# Patient Record
Sex: Female | Born: 1935 | Race: White | Hispanic: No | Smoking: Never smoker
Health system: Southern US, Community
[De-identification: ages and names within clinical notes are randomized; demographics above are authoritative.]

## PROBLEM LIST (undated history)

## (undated) DIAGNOSIS — Z9289 Personal history of other medical treatment: Secondary | ICD-10-CM

## (undated) DIAGNOSIS — Q211 Atrial septal defect: Secondary | ICD-10-CM

## (undated) DIAGNOSIS — I480 Paroxysmal atrial fibrillation: Secondary | ICD-10-CM

## (undated) DIAGNOSIS — R609 Edema, unspecified: Secondary | ICD-10-CM

## (undated) DIAGNOSIS — L719 Rosacea, unspecified: Secondary | ICD-10-CM

## (undated) DIAGNOSIS — M1990 Unspecified osteoarthritis, unspecified site: Secondary | ICD-10-CM

## (undated) DIAGNOSIS — Q2112 Patent foramen ovale: Secondary | ICD-10-CM

## (undated) HISTORY — DX: Edema, unspecified: R60.9

## (undated) HISTORY — DX: Patent foramen ovale: Q21.12

## (undated) HISTORY — PX: ANKLE SURGERY: SHX546

## (undated) HISTORY — DX: Personal history of other medical treatment: Z92.89

## (undated) HISTORY — DX: Unspecified osteoarthritis, unspecified site: M19.90

## (undated) HISTORY — PX: PARTIAL HYSTERECTOMY: SHX80

## (undated) HISTORY — PX: REPLACEMENT TOTAL KNEE: SUR1224

## (undated) HISTORY — DX: Atrial septal defect: Q21.1

## (undated) HISTORY — DX: Rosacea, unspecified: L71.9

## (undated) HISTORY — DX: Paroxysmal atrial fibrillation: I48.0

## (undated) HISTORY — PX: RECTOCELE REPAIR: SHX761

## (undated) HISTORY — PX: HIP SURGERY: SHX245

---

## 2005-09-06 ENCOUNTER — Inpatient Hospital Stay (HOSPITAL_COMMUNITY): Admission: RE | Admit: 2005-09-06 | Discharge: 2005-09-11 | Payer: Self-pay | Admitting: Orthopedic Surgery

## 2005-12-06 ENCOUNTER — Ambulatory Visit (HOSPITAL_COMMUNITY): Admission: RE | Admit: 2005-12-06 | Discharge: 2005-12-06 | Payer: Self-pay | Admitting: Orthopedic Surgery

## 2006-02-07 ENCOUNTER — Encounter: Payer: Self-pay | Admitting: Cardiology

## 2006-09-17 IMAGING — CR DG CHEST 2V
2 series · 2 of 2 positions shown · non-contrast
Comparison: None.

CLINICAL DATA: Pre-op workup for right knee surgery.  History of left breast tumor.  Hypertension. 
 CHEST - 2 VIEW:

[view not recorded (1 of 2)]
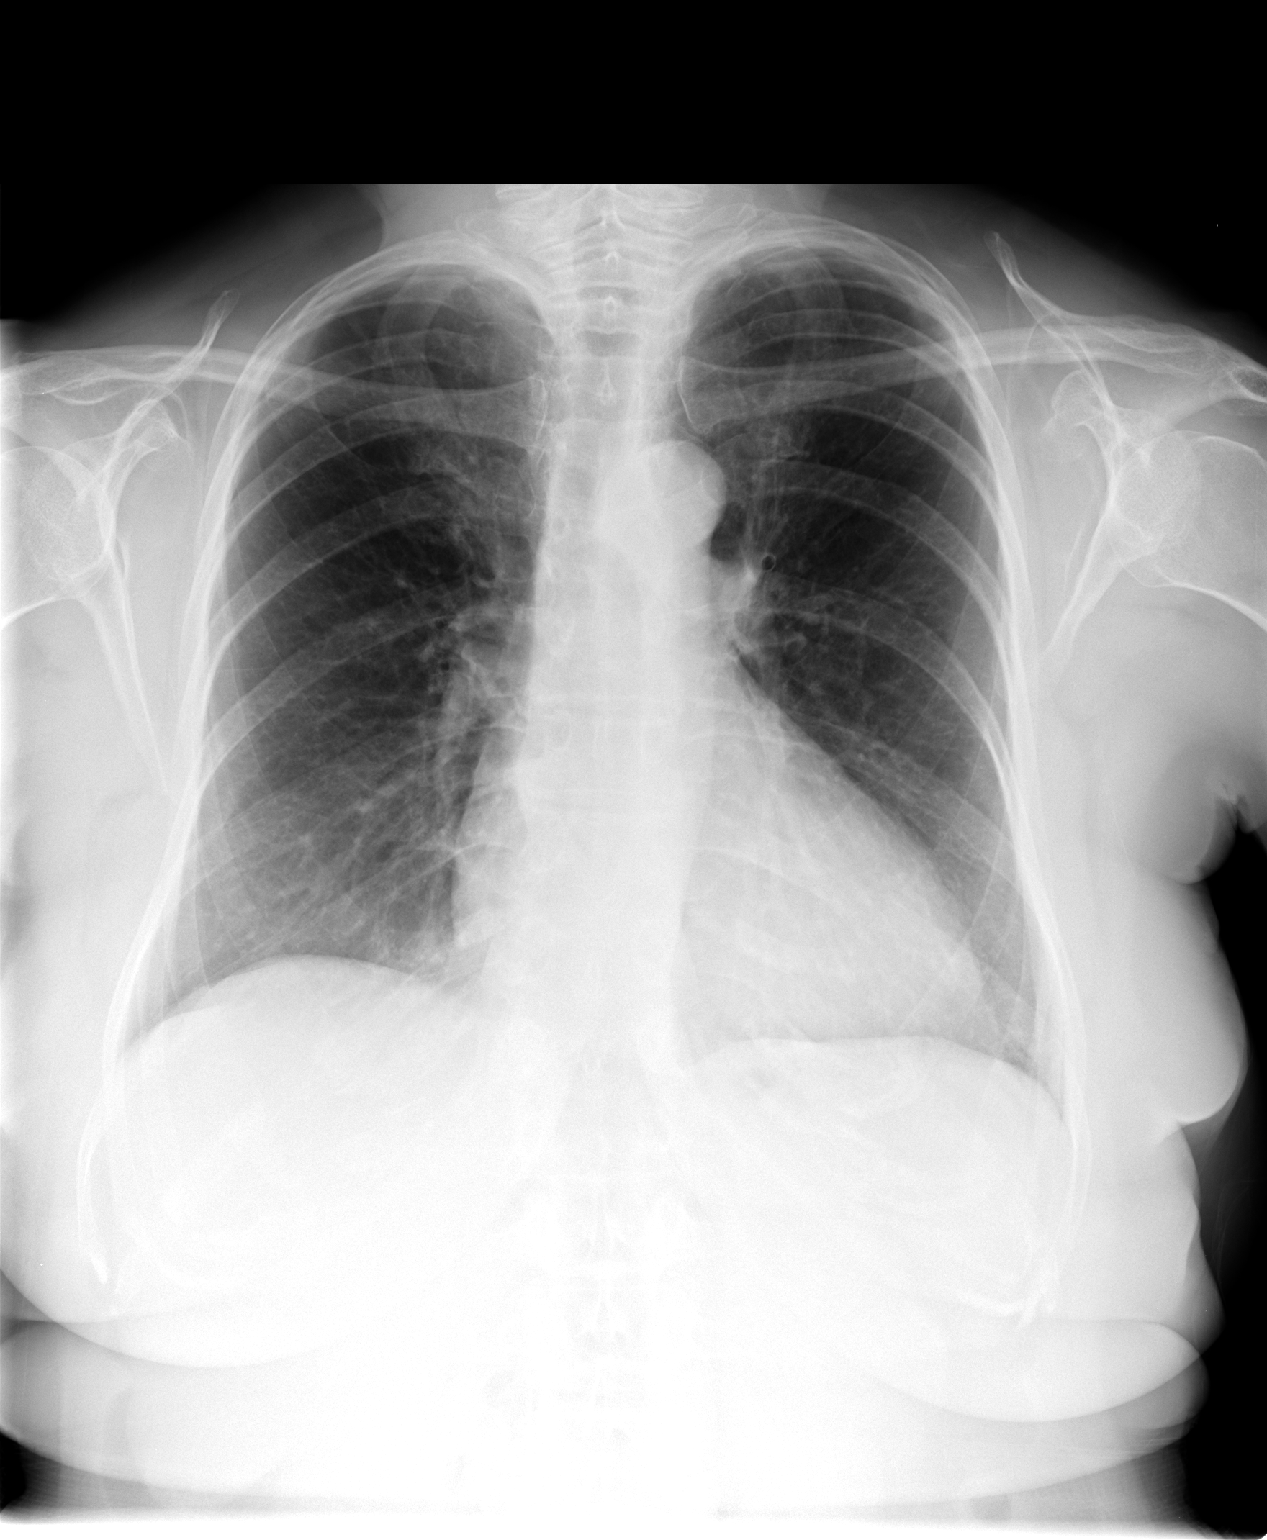

[view not recorded (2 of 2)]
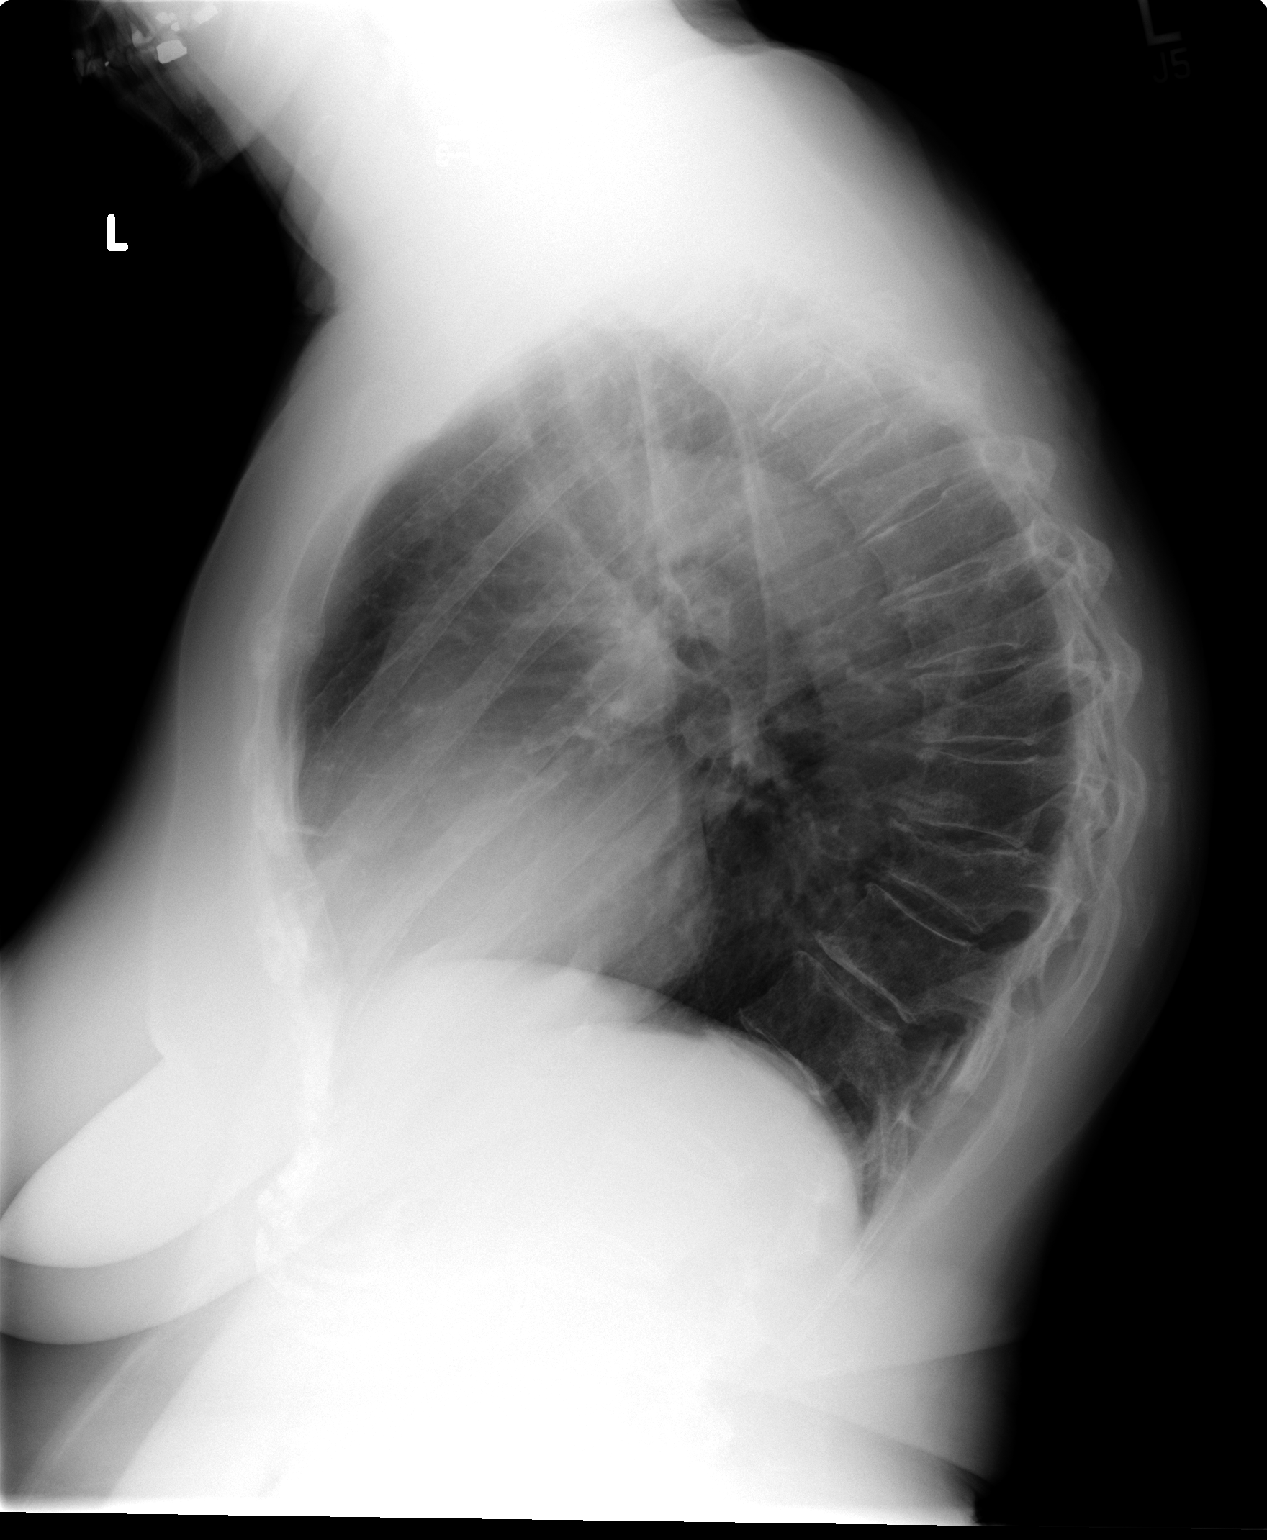

[2 of 2 positions shown; findings below may reference images not displayed]

FINDINGS: Cardiomegaly.  No pulmonary vascular congestion or active lung process.  Accentuated thoracic kyphosis.  Degenerative changes mid to lower thoracic spine.  Anterior wedging of a lower thoracic vertebral body probably T-10 likely representing a chronic finding.
IMPRESSION: Mild cardiomegaly.  No acute chest findings.

## 2006-09-24 IMAGING — CR DG KNEE 1-2V*R*
2 series · 2 of 2 positions shown · non-contrast
Comparison: none

CLINICAL DATA: Post right TKA revision.  
 RIGHT KNEE ? 2 VIEW:
 Distal femoral, proximal tibial and patella prosthetic components appear in satisfactory position and alignment.  Radiopaque drain in the anterior row of superficial staples.  Methyl methacrylate surrounds the intramedullary aspect of the tibial prosthesis.

[view not recorded (1 of 2)]
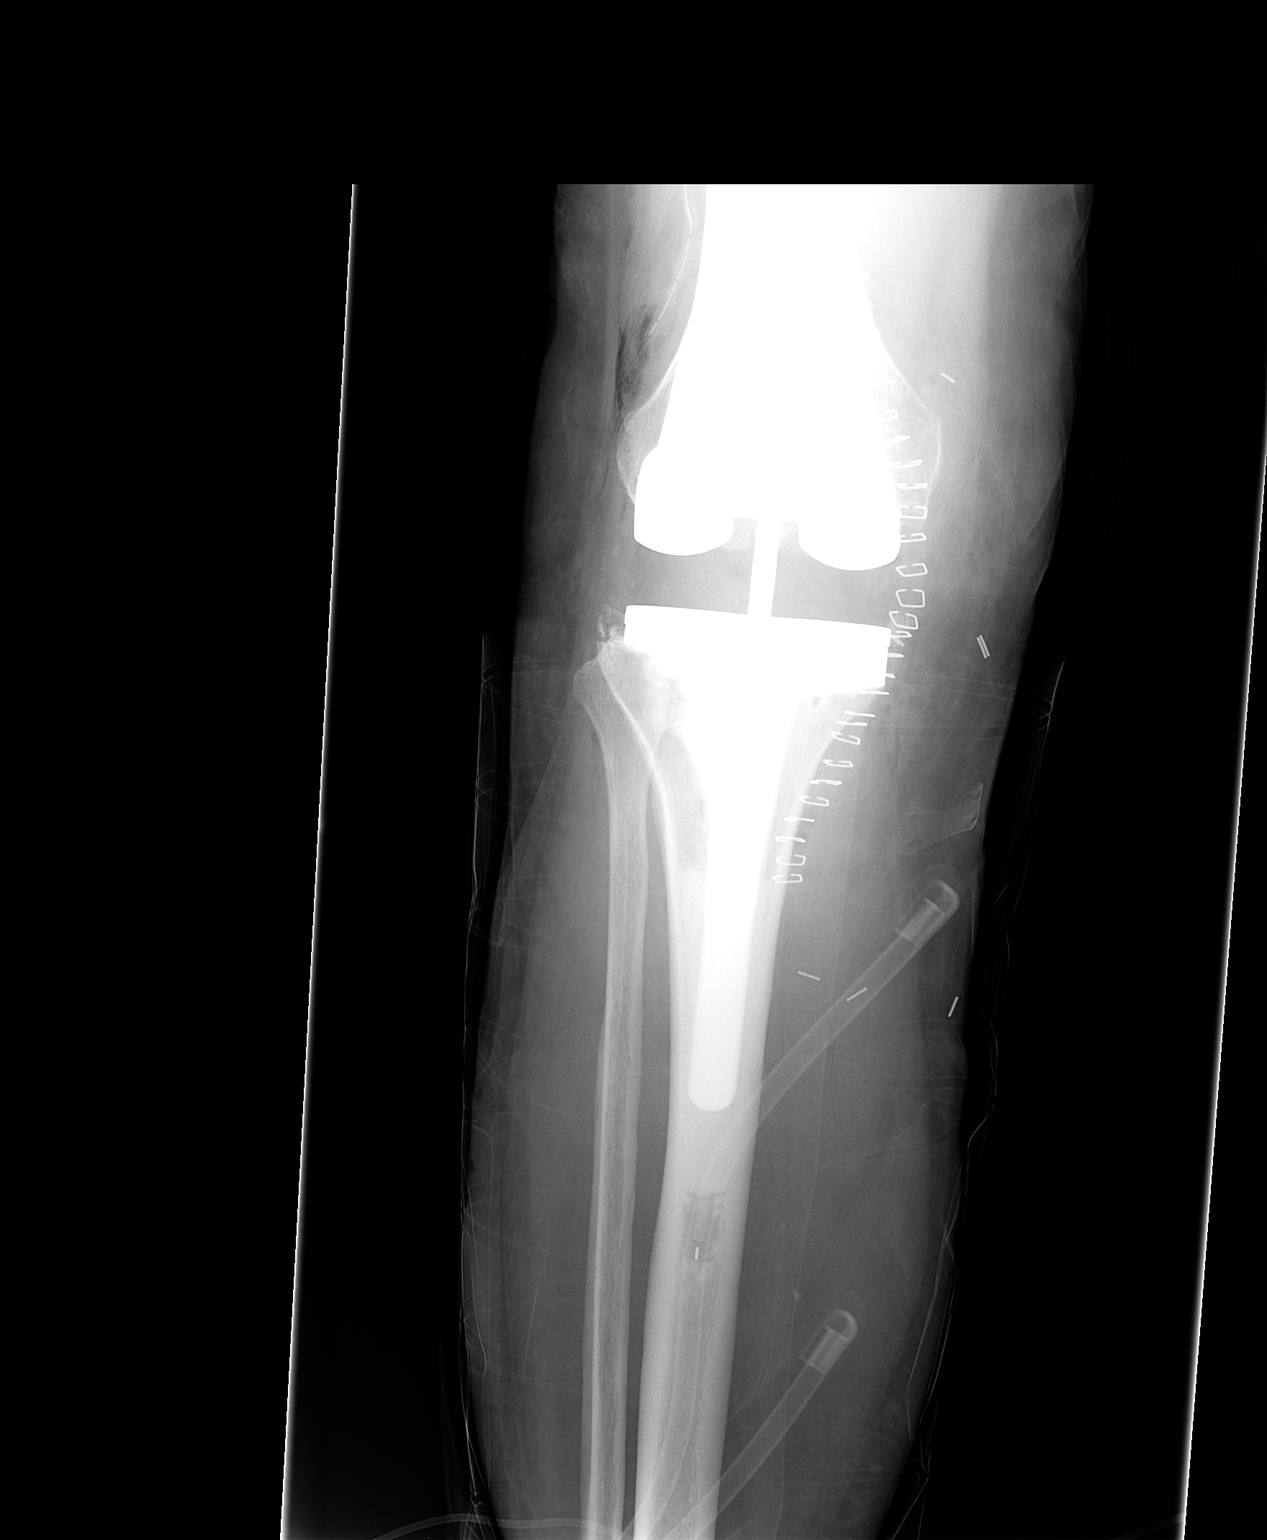

[view not recorded (2 of 2)]
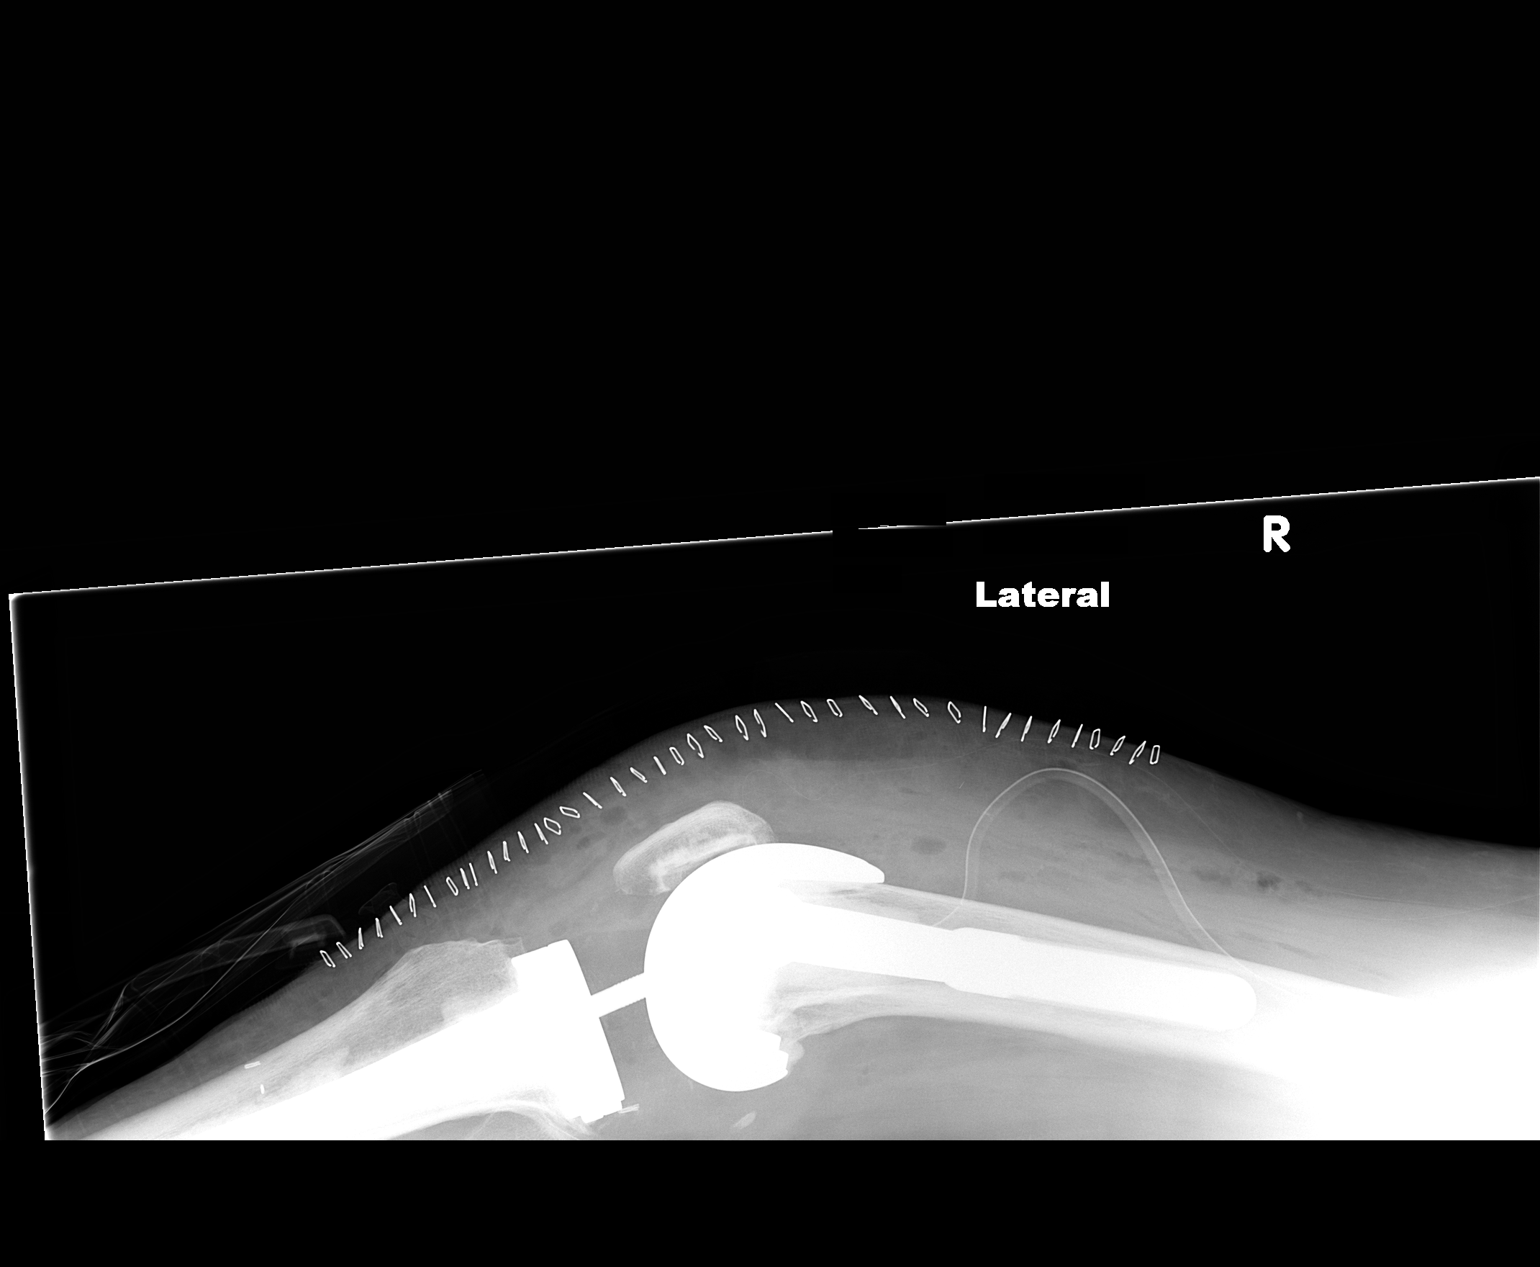

[2 of 2 positions shown; findings below may reference images not displayed]

IMPRESSION: Satisfactory appearance of right TKR.

## 2007-06-25 ENCOUNTER — Ambulatory Visit: Payer: Self-pay | Admitting: Cardiology

## 2007-06-28 ENCOUNTER — Encounter: Payer: Self-pay | Admitting: Cardiology

## 2007-07-15 ENCOUNTER — Ambulatory Visit: Payer: Self-pay | Admitting: Cardiology

## 2007-07-25 ENCOUNTER — Encounter: Payer: Self-pay | Admitting: Cardiology

## 2007-08-20 ENCOUNTER — Ambulatory Visit: Payer: Self-pay | Admitting: Cardiology

## 2007-10-07 ENCOUNTER — Encounter: Payer: Self-pay | Admitting: Cardiology

## 2008-04-28 ENCOUNTER — Ambulatory Visit: Payer: Self-pay | Admitting: Cardiology

## 2008-04-28 ENCOUNTER — Encounter: Payer: Self-pay | Admitting: Cardiology

## 2008-04-28 DIAGNOSIS — M818 Other osteoporosis without current pathological fracture: Secondary | ICD-10-CM | POA: Insufficient documentation

## 2008-04-28 DIAGNOSIS — F3289 Other specified depressive episodes: Secondary | ICD-10-CM | POA: Insufficient documentation

## 2008-04-28 DIAGNOSIS — I4891 Unspecified atrial fibrillation: Secondary | ICD-10-CM

## 2008-04-28 DIAGNOSIS — K120 Recurrent oral aphthae: Secondary | ICD-10-CM | POA: Insufficient documentation

## 2008-04-28 DIAGNOSIS — E119 Type 2 diabetes mellitus without complications: Secondary | ICD-10-CM | POA: Insufficient documentation

## 2008-04-29 ENCOUNTER — Encounter: Payer: Self-pay | Admitting: Cardiology

## 2008-07-29 ENCOUNTER — Encounter: Payer: Self-pay | Admitting: Cardiology

## 2009-01-20 ENCOUNTER — Encounter (INDEPENDENT_AMBULATORY_CARE_PROVIDER_SITE_OTHER): Payer: Self-pay | Admitting: *Deleted

## 2009-01-29 ENCOUNTER — Ambulatory Visit: Payer: Self-pay | Admitting: Cardiology

## 2009-01-29 DIAGNOSIS — F411 Generalized anxiety disorder: Secondary | ICD-10-CM

## 2009-01-29 DIAGNOSIS — Z8679 Personal history of other diseases of the circulatory system: Secondary | ICD-10-CM | POA: Insufficient documentation

## 2009-02-24 ENCOUNTER — Encounter: Payer: Self-pay | Admitting: Cardiology

## 2009-03-01 ENCOUNTER — Encounter: Payer: Self-pay | Admitting: Cardiology

## 2009-03-04 ENCOUNTER — Encounter (INDEPENDENT_AMBULATORY_CARE_PROVIDER_SITE_OTHER): Payer: Self-pay | Admitting: *Deleted

## 2009-03-08 ENCOUNTER — Ambulatory Visit: Payer: Self-pay | Admitting: Cardiology

## 2009-05-20 ENCOUNTER — Encounter: Payer: Self-pay | Admitting: Cardiology

## 2009-05-20 ENCOUNTER — Encounter: Payer: Self-pay | Admitting: Physician Assistant

## 2009-05-20 ENCOUNTER — Ambulatory Visit: Payer: Self-pay | Admitting: Cardiology

## 2009-05-21 ENCOUNTER — Encounter: Payer: Self-pay | Admitting: Cardiology

## 2009-06-22 ENCOUNTER — Ambulatory Visit: Payer: Self-pay | Admitting: Cardiology

## 2009-06-30 ENCOUNTER — Ambulatory Visit: Payer: Self-pay | Admitting: Cardiology

## 2009-07-14 ENCOUNTER — Telehealth (INDEPENDENT_AMBULATORY_CARE_PROVIDER_SITE_OTHER): Payer: Self-pay | Admitting: *Deleted

## 2009-07-28 ENCOUNTER — Encounter: Payer: Self-pay | Admitting: Cardiology

## 2009-07-28 ENCOUNTER — Ambulatory Visit: Payer: Self-pay | Admitting: Cardiology

## 2009-12-14 ENCOUNTER — Ambulatory Visit: Payer: Self-pay | Admitting: Cardiology

## 2010-06-28 NOTE — Progress Notes (Signed)
Summary: rash   Phone Note Call from Patient   Summary of Call: Pt. came by office today.  C/O of having rash since being started on her Digoxin on 1/25.  Rash appears to be small red areas that group together in varied spots on body.  States primarily from her waist down.  Does have itching with this.  Also, states she was given Metoprolol ER 25mg  1/2 tab two times a day by Dr. Sherril Croon, but is no longer seeing him.  Is now seeing Dr. Leandrew Koyanagi now.  Who should she obtain refill from?   Initial call taken by: Hoover Brunette, LPN,  July 14, 2009 5:01 PM  Follow-up for Phone Call        d/c digoxin and increase Metoprol ER to 25mg  by mouth two times a day- we can refill. Needs RN visit in 5-7 days to check vitals, ECG and see if rash still present.  Follow-up by: Lewayne Bunting, MD, [facility],  July 19, 2009 6:39 AM  Additional Follow-up for Phone Call Additional follow up Details #1::        Patient notified.   Nurse visit scheduled for 3/2.  Patient verbalized understanding.  Additional Follow-up by: Hoover Brunette, LPN,  July 20, 2009 10:53 AM    New/Updated Medications: METOPROLOL SUCCINATE 25 MG XR24H-TAB (METOPROLOL SUCCINATE) Take 1 tablet by mouth two times a day (place on file) Prescriptions: METOPROLOL SUCCINATE 25 MG XR24H-TAB (METOPROLOL SUCCINATE) Take 1 tablet by mouth two times a day (place on file)  #60 x 6   Entered by:   Hoover Brunette, LPN   Authorized by:   Lewayne Bunting, MD, [facility]   Signed by:   Hoover Brunette, LPN on 16/02/9603   Method used:   Electronically to        [facility]* (retail)       [address]       [place], [place]  [address]       Ph: [phone]       Fax: [phone]   RxID:   [id number]

## 2010-06-28 NOTE — Letter (Signed)
Summary: Discharge Summary [facility]  Discharge Summary [facility]   Imported By: Zachary George 06/22/2009 09:41:00  _____________________________________________________________________  External Attachment:    Type:   Image     Comment:   External Document

## 2010-06-28 NOTE — Consult Note (Signed)
Summary: CARDIOLOGY CONSULT/ [facility]  CARDIOLOGY CONSULT/ [facility]   Imported By: Zachary George 06/22/2009 09:40:13  _____________________________________________________________________  External Attachment:    Type:   Image     Comment:   External Document

## 2010-06-28 NOTE — Assessment & Plan Note (Signed)
Summary: ekg, vitals  --agh  Nurse Visit   Vital Signs:  Patient profile:   77 year old female Height:      61 inches Weight:      144 pounds Pulse rate:   58 / minute BP sitting:   153 / 78  (left arm) Cuff size:   regular  Vitals Entered By: Carlye Grippe (July 28, 2009 8:46 AM)  Serial Vital Signs/Assessments:  Time      Position  BP       Pulse  Resp  Temp     By 8:51 AM             144/76                         Carlye Grippe  CC: nurse bp check,ekg Comments patients rash has cleared up now.     Preventive Screening-Counseling & Management  Alcohol-Tobacco     Smoking Status: never  Current Medications (verified): 1)  Warfarin Sodium 5 Mg Tabs (Warfarin Sodium) .... As Directed 2)  Actonel 35 Mg Tabs (Risedronate Sodium) .... Once A Week 3)  Ery-Tab 333 Mg Tbec (Erythromycin Base) .... Take 1 Tablet By Mouth Two Times A Day 4)  Multivitamin .... One Tablet P.o. Daily 5)  Calcium and Vitamin D .... Two Times A Day 6)  Metoprolol Succinate 25 Mg Xr24h-Tab (Metoprolol Succinate) .... Take 1 Tablet By Mouth Two Times A Day (Place On File) 7)  Potassium Chloride Crys Cr 20 Meq Cr-Tabs (Potassium Chloride Crys Cr) .... Take One Tablet By Mouth Daily 8)  Amitriptyline Hcl 25 Mg Tabs (Amitriptyline Hcl) .... Take One Half Tablet By Mouth At Bedtime As Needed 9)  Metronidazole 0.75 % Lotn (Metronidazole) .... Apply To Face Once Daily 10)  Pandel 0.1 % Crea (Hydrocortisone Probutate) .... Apply To Face Once Daily 11)  Metamucil 30.9 % Powd (Psyllium) .... Once Daily 12)  Stool Softner .... Take 1 Tab By Mouth At Bedtime 13)  Toprol Xl 25 Mg Xr24h-Tab (Metoprolol Succinate) .... Take 1 Tablet By Mouth Once A Day As Needed For Rapid Heartbeat  Allergies (verified): 1)  ! Digoxin 2)  Keflex  Comments:  Nurse/Medical Assistant: The patient's medications and allergies were reviewed with the patient and were updated in the Medication and Allergy Lists. List  reviewed.  Orders Added: 1)  EKG w/ Interpretation [93000]  rash resolving after stopping digoxin. ECG normal sinus rhythm. Continue current therapy.  Lewayne Bunting, MD, [facility]  July 28, 2009 9:02 AM  Appended Document: ekg, vitals  --agh Patient informed of the above.

## 2010-06-28 NOTE — Assessment & Plan Note (Signed)
Summary: 6 MO FU PER APRIL REMINDER   Visit Type:  Follow-up Primary Provider:  Dr. Leandrew Koyanagi   History of Present Illness: the patient is an 77 year old female with history of paroxysmal atrial fibrillation. The patient has had no recurrent episodes. She has no evidence of significant coronary artery disease with a negative adenosine Cardiolite study in February 2009. She is asymptomatic from cardiac standpoint.  She reports no publications presyncope or syncope. She has no chest pain. She has some lesions of poison ivy no left neck area.  She does report a dry mouth and sore gums which he feels is secondary to her metoprolol. She has a history of rosacea  Preventive Screening-Counseling & Management  Alcohol-Tobacco     Smoking Status: never  Current Medications (verified): 1)  Warfarin Sodium 5 Mg Tabs (Warfarin Sodium) .... As Directed 2)  Actonel 35 Mg Tabs (Risedronate Sodium) .... Once A Week 3)  Ery-Tab 333 Mg Tbec (Erythromycin Base) .... Take 1 Tablet By Mouth Two Times A Day 4)  Multivitamin .... One Tablet P.o. Daily 5)  Calcium and Vitamin D .... Two Times A Day 6)  Atenolol 50 Mg Tabs (Atenolol) .... Take 1 Tablet By Mouth Once A Day 7)  Potassium Chloride Crys Cr 20 Meq Cr-Tabs (Potassium Chloride Crys Cr) .... Take One Tablet By Mouth Daily 8)  Amitriptyline Hcl 25 Mg Tabs (Amitriptyline Hcl) .... Take One Half Tablet By Mouth At Bedtime As Needed 9)  Metronidazole 0.75 % Lotn (Metronidazole) .... Apply To Face Once Daily 10)  Pandel 0.1 % Crea (Hydrocortisone Probutate) .... Apply To Face Once Daily 11)  Metamucil 30.9 % Powd (Psyllium) .... Once Daily 12)  Stool Softner .... Take 1 Tab By Mouth At Bedtime 13)  Toprol Xl 25 Mg Xr24h-Tab (Metoprolol Succinate) .... Take 1 Tablet By Mouth Once A Day As Needed For Rapid Heartbeat 14)  Lasix 20 Mg Tabs (Furosemide) .... Take By Mouth As Needed 15)  Desoximetasone 0.25 % Crea (Desoximetasone) .... Apply Daily As Needed For  Poison Ivy  Allergies: 1)  ! Digoxin 2)  Keflex  Comments:  Nurse/Medical Assistant: The patient's medications were reviewed with the patient and were updated in the Medication List. Pt brought a list of medications to office visit.  Cyril Loosen, RN, BSN (December 14, 2009 3:29 PM)  Past History:  Family History: Last updated: 04/28/2008 noncontributory  Social History: Last updated: 04/28/2008 the patient is widowed she does not smoke or drink.  Risk Factors: Smoking Status: never (12/14/2009)  Past Medical History: paroxysmal atrial fibrillation,CHADS2 of two Coumadin therapy follow-up in Dr. Sherril Croon diabetes mellitus nonischemic cardio lyte study February of 2009 normal LV function osteoarthritis chronic pedal edema fatigue Rosacea  Review of Systems       The patient complains of rash and skin lesions.  The patient denies fatigue, malaise, fever, weight gain/loss, vision loss, decreased hearing, hoarseness, chest pain, palpitations, shortness of breath, prolonged cough, wheezing, sleep apnea, coughing up blood, abdominal pain, blood in stool, nausea, vomiting, diarrhea, heartburn, incontinence, blood in urine, muscle weakness, joint pain, leg swelling, headache, fainting, dizziness, depression, anxiety, enlarged lymph nodes, easy bruising or bleeding, and environmental allergies.    Vital Signs:  Patient profile:   77 year old female Height:      61 inches Weight:      146.50 pounds BMI:     27.78 Pulse rate:   55 / minute BP sitting:   143 / 78  (left arm) Cuff size:  regular  Vitals Entered By: Cyril Loosen, RN, BSN (December 14, 2009 3:24 PM)  Nutrition Counseling: Patient's BMI is greater than 25 and therefore counseled on weight management options. Comments Follow up visit   Physical Exam  Additional Exam:  General: Well-developed, well-nourished in no distress head: Normocephalic and atraumatic eyes PERRLA/EOMI intact, conjunctiva and lids  normal nose: No deformity or lesions mouth normal dentition, normal posterior pharynx neck: Supple, no JVD.  No masses, thyromegaly or abnormal cervical nodes lungs: Normal breath sounds bilaterally without wheezing.  Normal percussion heart: regular rate and rhythm with normal S1 and S2, no S3 or S4.  PMI is normal.  No pathological murmurs abdomen: Normal bowel sounds, abdomen is soft and nontender without masses, organomegaly or hernias noted.  No hepatosplenomegaly musculoskeletal: Back normal, normal gait muscle strength and tone normal pulsus: Pulse is normal in all 4 extremities Extremities: No peripheral pitting edema neurologic: Alert and oriented x 3 skin: Intact without lesions or rashes cervical nodes: No significant adenopathy psychologic: Normal affect    Impression & Recommendations:  Problem # 1:  COUMADIN THERAPY (ICD-V58.61) could consider switching the patient to dabigatran particularly because she's taking multiple antibiotics control the rosacea. If her INR is quite difficult to control then I would certainly make the switch.  Problem # 2:  PALPITATIONS, HX OF (ICD-V12.50) no recurrent palpitations the patient is stable. I did change her from Toprol to atenolol but she feels she has side effects to the Toprol as outlined above. I told her it is worthwhile to try a different beta blocker  Problem # 3:  ATRIAL FIBRILLATION (ICD-427.31) the patient remains in normal sinus rhythm. Her updated medication list for this problem includes:    Warfarin Sodium 5 Mg Tabs (Warfarin sodium) .Marland Kitchen... As directed    Atenolol 50 Mg Tabs (Atenolol) .Marland Kitchen... Take 1 tablet by mouth once a day    Toprol Xl 25 Mg Xr24h-tab (Metoprolol succinate) .Marland Kitchen... Take 1 tablet by mouth once a day as needed for rapid heartbeat  Orders: EKG w/ Interpretation (93000)  Patient Instructions: 1)  Desoximetasone topical cream for poison ivy  2)  Stop Metoprolol 3)  Begin Atenolol 50mg  daily 4)  Follow  up in  1 year Prescriptions: DESOXIMETASONE 0.25 % CREA (DESOXIMETASONE) apply daily as needed for poison ivy  #15gm x 1   Entered by:   Hoover Brunette, LPN   Authorized by:   Lewayne Bunting, MD, [facility]   Signed by:   Hoover Brunette, LPN on 82/50/5397   Method used:   Electronically to        [facility]* (retail)       [address]       [place], [place]  [address]       Ph: [phone]       Fax: [phone]   RxID:   [id number] ATENOLOL 50 MG TABS (ATENOLOL) Take 1 tablet by mouth once a day  #30 x 11   Entered by:   Hoover Brunette, LPN   Authorized by:   Lewayne Bunting, MD, [facility]   Signed by:   Hoover Brunette, LPN on 19/41/7408   Method used:   Electronically to        [facility]* (retail)       [address]       [place], [place]  [address]       Ph: [phone]  Fax: [phone]   RxID:   [id number]   Handout requested.

## 2010-06-28 NOTE — Assessment & Plan Note (Signed)
Summary: bp check & HR  Nurse Visit   Vital Signs:  Patient profile:   77 year old female Height:      61 inches Weight:      142 pounds Pulse rate:   58 / minute BP sitting:   137 / 80  (left arm) Cuff size:   regular  Vitals Entered By: Carlye Grippe (June 30, 2009 8:32 AM)  CC:Follow-up HTN--yes PNT:IRWERX meds?--yes Side effects?--no Chest pain, SOB, Dizziness?--no A/P: 1. HTN (401.1)             At goal?              If no, physician will be notified.              Follow up in ...Marland KitchenMarland KitchenMarland Kitchen  5 minutes was spent with the patient.     Serial Vital Signs/Assessments:  Time      Position  BP       Pulse  Resp  Temp     By 8:44 AM             131/81   56                    Carlye Grippe   Visit Type:  nurse BP check  CC:  nurse BP & HR check.  CC: nurse BP & HR check   Preventive Screening-Counseling & Management  Alcohol-Tobacco     Smoking Status: never  Current Medications (verified): 1)  Warfarin Sodium 5 Mg Tabs (Warfarin Sodium) .... As Directed 2)  Actonel 35 Mg Tabs (Risedronate Sodium) .... Once A Week 3)  Ery-Tab 333 Mg Tbec (Erythromycin Base) .... Take 1 Tablet By Mouth Two Times A Day 4)  Multivitamin .... One Tablet P.o. Daily 5)  Calcium and Vitamin D .... Two Times A Day 6)  Metoprolol Succinate 25 Mg Xr24h-Tab (Metoprolol Succinate) .... Take 1/2 Talbet By Mouth Two Times A Day 7)  Potassium Chloride Crys Cr 20 Meq Cr-Tabs (Potassium Chloride Crys Cr) .... Take One Tablet By Mouth Daily 8)  Amitriptyline Hcl 25 Mg Tabs (Amitriptyline Hcl) .... Take One Half Tablet By Mouth At Bedtime As Needed 9)  Metronidazole 0.75 % Lotn (Metronidazole) .... Apply To Face Once Daily 10)  Pandel 0.1 % Crea (Hydrocortisone Probutate) .... Apply To Face Once Daily 11)  Metamucil 30.9 % Powd (Psyllium) .... Once Daily 12)  Stool Softner .... Take 1 Tab By Mouth At Bedtime 13)  Toprol Xl 25 Mg Xr24h-Tab (Metoprolol Succinate) .... Take 1 Tablet By Mouth Once A  Day As Needed For Rapid Heartbeat 14)  Digoxin 0.25 Mg Tabs (Digoxin) .... Take 1 Tablet By Mouth Once A Day  Allergies (verified): 1)  Keflex  Comments:  Nurse/Medical Assistant: The patient's medications and allergies were reviewed with the patient and were updated in the Medication and Allergy Lists. List reviewed.  Orders Added: 1)  Est. Patient Level I [54008]

## 2010-06-28 NOTE — Assessment & Plan Note (Signed)
Summary: eph-post [facility] 12/24 per ann w/daysprings   Visit Type:  Follow-up Primary Provider:  Dr. Leandrew Koyanagi  CC:  follow-up visit.  History of Present Illness: the patient is a 77 year old female with history of recurrent tachypalpitations recently hospitalized in December of 2010.  The patient has a history of proximal atrial fibrillation.  She is on chronic Coumadin therapy.  She has no evidence of significant coronary artery disease with a negative adenosine Cardiolite February 2009 with an ejection fraction of 54%.  The patient presents for follow-up.  She reports a single episode of two hours of "fluttering" the episode that brought her to the hospital was much longer and last approximate 6 hours.  At the time of her hospitalization however there was no proof of ischemia or tachycardia arrhythmia.  He was felt the patient could follow up for the visit with me.  The patient denies any chest pain, orthopnea PND or syncope.the patient had an event monitor performed in September of 2010 and no significant arrhythmias were noted.  Clinical Review Panels:  CXR CXR results Clinical Data: History of shortness of breath and dizziness.                   PORTABLE CHEST - 1 VIEW                             Comparison: 02/07/2006 CT study report                   Findings: The cardiac silhouette is minimally enlarged. No         pulmonary edema, pneumonia, or pleural effusion is seen.         Nonaneurysmal aortic calcification is present.  There is slight         scoliosis convexity to the right.                   IMPRESSION:         No acute cardiopulmonary process is identified. The cardiac         silhouette is minimally enlarged.  There is slight scoliosis.           Gilman Schmidt, MD (05/20/2009)  Nuclear Stress Testing Nuclear Stress Test Findings normal LV perfusion.  Stress testing induced no chest pain symptoms and no EKG changes consistent with ischemia.  Global LV function was  normal with an EF of 54% there is in addition there was normal wall motion (07/15/2007) Event Monitor  Event Monitor Findings normal sinus no significant arrhythmias no definite data fibrillation (02/03/2009)    Preventive Screening-Counseling & Management  Alcohol-Tobacco     Smoking Status: never  Current Medications (verified): 1)  Warfarin Sodium 5 Mg Tabs (Warfarin Sodium) .... As Directed 2)  Actonel 35 Mg Tabs (Risedronate Sodium) .... Once A Week 3)  Ery-Tab 333 Mg Tbec (Erythromycin Base) .... Take 1 Tablet By Mouth Two Times A Day 4)  Multivitamin .... One Tablet P.o. Daily 5)  Calcium and Vitamin D .... Two Times A Day 6)  Metoprolol Succinate 25 Mg Xr24h-Tab (Metoprolol Succinate) .... Take 1/2 Talbet By Mouth Two Times A Day 7)  Potassium Chloride Crys Cr 20 Meq Cr-Tabs (Potassium Chloride Crys Cr) .... Take One Tablet By Mouth Daily 8)  Amitriptyline Hcl 25 Mg Tabs (Amitriptyline Hcl) .... Take One Half Tablet By Mouth At Bedtime As Needed 9)  Metronidazole 0.75 % Lotn (Metronidazole) .Marland KitchenMarland KitchenMarland Kitchen  Apply To Face Once Daily 10)  Pandel 0.1 % Crea (Hydrocortisone Probutate) .... Apply To Face Once Daily 11)  Metamucil 30.9 % Powd (Psyllium) .... Once Daily 12)  Stool Softner .... Take 1 Tab By Mouth At Bedtime 13)  Toprol Xl 25 Mg Xr24h-Tab (Metoprolol Succinate) .... Take 1 Tablet By Mouth Once A Day As Needed For Rapid Heartbeat 14)  Digoxin 0.25 Mg Tabs (Digoxin) .... Take 1 Tablet By Mouth Once A Day  Allergies (verified): 1)  Keflex  Comments:  Nurse/Medical Assistant: The patient's medications and allergies were reviewed with the patient and were updated in the Medication and Allergy Lists. List reviewed.  Past History:  Past Medical History: Last updated: 04/28/2008 paroxysmal atrial fibrillation,CHADS2 of two Coumadin therapy follow-up in Dr. Sherril Croon diabetes mellitus nonischemic cardio lyte study February of 2009 normal LV function osteoarthritis chronic pedal  edema fatigue  Family History: Last updated: 04/28/2008 noncontributory  Social History: Last updated: 04/28/2008 the patient is widowed she does not smoke or drink.  Risk Factors: Smoking Status: never (06/22/2009)  Review of Systems       The patient complains of palpitations.  The patient denies fatigue, malaise, fever, weight gain/loss, vision loss, decreased hearing, hoarseness, chest pain, shortness of breath, prolonged cough, wheezing, sleep apnea, coughing up blood, abdominal pain, blood in stool, nausea, vomiting, diarrhea, heartburn, incontinence, blood in urine, muscle weakness, joint pain, leg swelling, rash, skin lesions, headache, fainting, dizziness, depression, anxiety, enlarged lymph nodes, easy bruising or bleeding, and environmental allergies.    Vital Signs:  Patient profile:   77 year old female Height:      61 inches Weight:      143 pounds Pulse rate:   66 / minute BP sitting:   128 / 79  (left arm) Cuff size:   regular  Vitals Entered By: Carlye Grippe (June 22, 2009 2:37 PM) CC: follow-up visit   Physical Exam  Additional Exam:  General: Well-developed, well-nourished in no distress head: Normocephalic and atraumatic eyes PERRLA/EOMI intact, conjunctiva and lids normal nose: No deformity or lesions mouth normal dentition, normal posterior pharynx neck: Supple, no JVD.  No masses, thyromegaly or abnormal cervical nodes lungs: Normal breath sounds bilaterally without wheezing.  Normal percussion heart: regular rate and rhythm with normal S1 and S2, no S3 or S4.  PMI is normal.  No pathological murmurs abdomen: Normal bowel sounds, abdomen is soft and nontender without masses, organomegaly or hernias noted.  No hepatosplenomegaly musculoskeletal: Back normal, normal gait muscle strength and tone normal pulsus: Pulse is normal in all 4 extremities Extremities: No peripheral pitting edema neurologic: Alert and oriented x 3 skin: Intact without  lesions or rashes cervical nodes: No significant adenopathy psychologic: Normal affect    EKG  Procedure date:  06/22/2009  Findings:      normal sinus rhythm left axis deviation.  Nonspecific ST T wave changes.  Impression & Recommendations:  Problem # 1:  PALPITATIONS, HX OF (ICD-V12.50) the patient had a single episode of recurrent palpitations.  She has a prior history of documented atrial fibrillation.  I added digoxin .25 milligrams p.o.daily to her medical regimen.  Problem # 2:  COUMADIN THERAPY (ICD-V58.61) the patient on chronic warfarin therapy for stroke reduction risk in the setting of paroxysmal atrial fibrillation  Problem # 3:  ATRIAL FIBRILLATION (ICD-427.31) Assessment: Comment Only  Her updated medication list for this problem includes:    Warfarin Sodium 5 Mg Tabs (Warfarin sodium) .Marland Kitchen... As directed  Metoprolol Succinate 25 Mg Xr24h-tab (Metoprolol succinate) .Marland Kitchen... Take 1/2 talbet by mouth two times a day    Toprol Xl 25 Mg Xr24h-tab (Metoprolol succinate) .Marland Kitchen... Take 1 tablet by mouth once a day as needed for rapid heartbeat    Digoxin 0.25 Mg Tabs (Digoxin) .Marland Kitchen... Take 1 tablet by mouth once a day  Orders: EKG w/ Interpretation (93000)  Patient Instructions: 1)  Digoxin 0.25mg  daily 2)  Nurse visit next week to recheck blood pressure and heart rate - Wednesday, June 30, 2009 at 8:30. 3)  Follow up in  3 months.  Prescriptions: DIGOXIN 0.25 MG TABS (DIGOXIN) Take 1 tablet by mouth once a day  #30 x 6   Entered by:   Hoover Brunette, LPN   Authorized by:   Lewayne Bunting, MD, [facility]   Signed by:   Hoover Brunette, LPN on 30/86/5784   Method used:   Electronically to        [facility] (retail)       [address]       [place], [place]  [address]       Ph: [phone]       Fax: [phone]   RxID:   [id number]

## 2010-08-17 ENCOUNTER — Telehealth: Payer: Self-pay | Admitting: *Deleted

## 2010-08-17 NOTE — Telephone Encounter (Signed)
Patient states these 2 recent episodes have only happened at night.  Most recent one did last longer & made her feel dizzy and lightheaded like she may pass out.  Stated she did have SOB with this.  Questioned why she didn't go to ED & stated last time she went there, they did not do anything for her.  Advised her to go to ED if symptoms worsen.  Will forward to MD.  Patient verbalized understanding.

## 2010-08-17 NOTE — Telephone Encounter (Signed)
Left message to return call 

## 2010-09-02 NOTE — Telephone Encounter (Signed)
Suggest  RN visit  For orthostatics and ECG. Needs 21 day Cardionet monitor if still symptoms. Will see patient in 4- 6 weeks- earlier if findings on Cardionet monitor.

## 2010-09-05 NOTE — Telephone Encounter (Signed)
Patient notified.  States that she has not had anymore episodes.  Declines nurse visit and Cardionet at this time.  States that she has wore monitor in the past & both times it did not show anything.  States she will call back if happens again.  Patient due for yearly check in July.  Went ahead and scheduled OV for June 14 with GD.

## 2010-10-11 NOTE — Assessment & Plan Note (Signed)
[facility]                          [name] CARDIOLOGY OFFICE NOTE   [name] [name]                     MRN:          [id number]  DATE:06/25/2007                            DOB:          01/21/1928    REFERRING PHYSICIAN:  Doreen Beam, MD   REASON FOR REFERRAL:  Atrial fibrillation.   HISTORY OF PRESENT ILLNESS:  [name] [name] is a 77 year old female  patient with newly diagnosed diabetes mellitus who has been noting  palpitations over the last year.  She recently had a syncopal episode  and saw Dr. Sherril Croon who set up for an echocardiogram and cardiac event  monitor.  She tells me that 2 weeks ago she got up out of bed to go to  the bathroom, and she became near syncopal and eventually passed out.  She did bump her chin on the way down.  She has not had a recurrence  since then.  Results of her echocardiogram are still pending.  She did  have several strips return revealing paroxysmal atrial fibrillation with  heart rates up into the 170s.  She saw Dr. Sherril Croon today who placed her on  metoprolol ER 25 mg b.i.d., warfarin 5 mg a day and metformin 500 mg a  day for her diabetes.   The patient notes that her palpitations are often fast and they usually  occur at night.  She sometimes feels an irregular heartbeat.  She feels  a little bit more short of breath with this.  Over the last year, she  says that she has had worsening dyspnea on exertion.  This is fairly  mild and she describes NYHA class IIB symptoms.  She denies orthopnea.  She will awaken in the middle of the night from time to time, but she  does not describe true PND.  She has chronic pedal edema without change.  She has not had recurrent syncope since the episode 2 weeks ago.  She  denies any chest heaviness or tightness.   PAST MEDICAL HISTORY:  1. Diabetes mellitus type 2 - recently diagnosed.  2. Status post left parotidectomy.  3. Osteoporosis.  4. Osteoarthritis, status post right  total knee replacement x3.  5. Rosacea.  6. History of total abdominal hysterectomy.  7. History of appendectomy.   MEDICATIONS:  1. Warfarin 5 mg daily - followed by Dr. Sherril Croon.  2. Metoprolol ER 25 mg b.i.d.  3. Metformin 500 mg daily.  4. Aspirin 325 mg daily.  5. Potassium 20 mEq b.i.d.  6. Actonel 35 mg q. week.  7. Erythromycin 333 mg b.i.d.  8. Multivitamin daily.  9. Calcium 600 mg plus vitamin D three times a day.  10.Stool softener 100 mg daily.  11.MetroGel 0.75%.  12.Lotrisone cream 0.05%   ALLERGIES:  KEFLEX CAUSES NAUSEA.   SOCIAL HISTORY:  The patient is a widow.  Her husband died one month  ago.  She denies alcohol or tobacco abuse.  She has two children.  She  had one daughter that died from myocardial infarction at age 8.  She is  retired from [employer].   FAMILY HISTORY:  As noted above, she has one daughter who died from  myocardial infarction at age 51.  Her mother died from myocardial  infarction at age 24.   REVIEW OF SYSTEMS:  Please see HPI.  She denies any fevers, chills,  cough, melena, hematochezia, hematuria, dysuria.  She denies any  monocular blindness, unilateral weakness, difficulty with speech or  facial drooping.  She denies any symptoms of claudication.  Rest of the  review of systems are negative.   PHYSICAL EXAMINATION:  GENERAL:  She is a well-nourished, well developed  female.  VITAL SIGNS:  Blood pressure 122/76, pulse 68, weight of 145 pounds.  HEENT:  Normal.  NECK:  Without JVD.  LYMPH:  Without lymphadenopathy.  ENDOCRINE:  Without thyromegaly.  CARDIAC:  Normal S1, S2.  Regular rate and rhythm without murmur.  LUNGS:  Clear to auscultation bilaterally without wheezing, rhonchi or  rales.  ABDOMEN:  Soft, nontender with positive bowel sounds.  No organomegaly.  EXTREMITIES:  Edema 1+ bilaterally.  Calves are soft, nontender.  SKIN:  Warm and dry.  NEUROLOGIC:  She is alert and oriented x3.  Cranial nerves II-XII  grossly  intact.  VASCULAR:  She has no carotid bruits bilaterally, and her dorsalis pedis  and posterior tibialis pulses are 2+ bilaterally.  NEUROLOGIC:  She is alert and oriented x3.  Cranial nerves II-XII  grossly intact.   STUDIES:  Electrocardiogram in our office today reveals normal sinus  rhythm with a heart rate of 66, normal axis, low voltage, poor R wave  progression, no acute changes.   DATABASE:  As noted above, her event monitor today revealed paroxysms of  atrial fibrillation with heart rates from 157 up to 176.  Blood work  from January 12 revealed a glucose of 212, BUN of 16, creatinine 1.1,  sodium 137, potassium 3.2, TSH 1.23, hemoglobin 15.9, white count 9000.   IMPRESSION:  1. Paroxysmal atrial fibrillation.      a.     CHADS2 score of 2.      b.     Coumadin therapy initiated by Dr. Sherril Croon today.  2. Diabetes mellitus - new diagnosis  3. Dyspnea with exertion.      a.     Question secondary to atrial fibrillation.      b.     rule out ischemic heart disease.  4. Osteoarthritis.  5. Osteoporosis.  6. Rosacea.  7. History of left parotidectomy.  8. Chronic pedal edema.  9. Family history of coronary disease.   PLAN:  [name] [name] presents to office today for further evaluation of  her paroxysmal atrial fibrillation.  Her heart rate is quite fast when  she is in atrial fibrillation.  Dr. Sherril Croon has started her on Toprol XL 25  mg b.i.d. today, and she has only taken one dose.  She does have a  relatively slow heart rate when she is in normal sinus rhythm.  We will  need to keep a close eye on her rate and rhythm to rule out the  possibility of tachybrady syndrome.  She has already been started on  Coumadin and this is appropriate as her CHADS2 score is 2.  At this  point in time we plan to:  1. She will continue follow-up with Dr. Sherril Croon for her Coumadin.  2. She finishes her CardioNet monitor at the end of this week.  We      have asked her to call our office back in the  next several  days if      she continues to have episodes of palpitations.  If she does call      [place] back with continued palpitations after several doses of her      Toprol, we will initiate digoxin (0.25 mg b.i.d. times one day,      then 0.25 mg p.o. daily after that).  3. We will try to obtain the results of the echocardiogram performed      at Dr. Sherril Croon' office.  4. We will obtain the results of her CardioNet monitor once that is      completed.  5. We will set her up for an adenosine low-level stress Cardiolite      study to rule out the possibility of ischemia.  6. She has been advised to stop her aspirin now that she is on      Coumadin.  7. We will see her back in 4 weeks in follow-up.      Tereso Newcomer, PA-C  Electronically Signed      Learta Codding, MD,FACC  Electronically Signed   SW/MedQ  DD: 06/25/2007  DT: 06/26/2007  Job #: [id number]   cc:   Doreen Beam, MD

## 2010-10-11 NOTE — Assessment & Plan Note (Signed)
[facility]                          [place] [facility] OFFICE NOTE   CYNTHIS, PURINGTON                     MRN:          [id number]  DATE:08/20/2007                            DOB:          12/05/1927    REASON FOR VISIT:  Six-week followup.   HISTORY OF PRESENT ILLNESS:  Ms. Charrier is a 77 year old female  patient whom I saw initially on June 25, 2007 with paroxysmal atrial  fibrillation.  She had also been recently diagnosed with diabetes  mellitus.  She was placed on Coumadin, which is being followed by Dr.  Sherril Croon secondary to her CHADS2 score of 2.  She was also placed on  metoprolol ER 25 mg b.i.d.   The patient had been set up with an echocardiogram by Dr. Sherril Croon that was  read by Dr. Shelva Majestic and revealed mild concentric LVH, normal LV function  with an EF of 60%, mild AI and mild MR.  We set her up for a Cardiolite  study, which was done July 15, 2007.  This showed normal LV  perfusion and normal LV function.  The follow-up report on her CardioNet  monitor revealed several episodes of paroxysmal atrial fibrillation.  Her highest heart rate was the day that we initially saw her and was up  in the 170s.  The readings on June 26, 1007 and in June 27, 2007  were  normal sinus rhythm with a minimum heart rate of 59.   The patient states she is doing well.  She has had less palpitations  since she was started on metoprolol ER.  There was one time when she had  to take short-acting metoprolol 25 mg for palpitations.  Her  palpitations improved over about 10 minutes.  She denies any syncope or  near syncope.  Denies any chest pain or significant shortness of breath.   MEDICATIONS:  Warfarin as directed by Dr. Sherril Croon, metoprolol ER 25 mg  b.i.d., metformin 500 mg daily, potassium 20 mEq b.i.d., Actonel 35 mg  every week, Ery-Tab 333 mg b.i.d., multivitamin, calcium plus vitamin D,  stool softener, MetroGel facial, Lotrisone cream,  metoprolol 25 mg  p.r.n.   PHYSICAL EXAM:  She is a well-nourished, well-developed female in no  acute distress.  Blood pressure 123/71, pulse 60, weight 140.4 pounds.  Oxygen saturation  94% on room air.  HEENT:  Normal.  NECK:  Without JVD at 90 degrees.  CARDIAC:  Normal S1 and S2.  Regular rate and rhythm.  LUNGS:  Clear to auscultation bilaterally.  ABDOMEN:  Soft, nontender.  Extremities with trace edema bilaterally.  NEUROLOGIC:  She is alert and oriented x3.  Cranial nerves II-XII are  grossly intact.   IMPRESSION:  1. Paroxysmal atrial fibrillation.      a.     CHADS2 score of 2 (diabetes mellitus, age).      b.     Coumadin therapy followed by Dr. Sherril Croon.  2. Diabetes mellitus  3. Nonischemic Cardiolite study in February in 2009.  4. Good left ventricular function.      a.     Mild AI.  b.     Mild mitral regurgitation.  5. Osteoarthritis.  6. Chronic pedal edema.  7. Fatigue.   DISCUSSION:  Ms. Michaelson presents back to the office today for  followup.  It appears that she has paroxysmal atrial fibrillation.  It  seems to be better controlled on low-dose, long-acting metoprolol b.i.d.  She also is in need of Coumadin therapy secondary to her significant  thromboembolic risk factors.  This will be followed by Dr. Sherril Croon.  She  did note some fatigue to me today.  I question whether or not this may  be a result of her beta-blocker therapy.  She does note that she took  care of her husband who was ill for quite some time and he recently  passed away.  She questions whether or not she is still fatigued from  this ordeal.  We had a discussion about whether or not to decrease her  metoprolol.  At this point in time she would like to remain on her  current dosage.   PLAN:  1. No medication changes today.  2. She will follow up in 6 months or sooner p.r.n.  3. If she continues to feel fatigued, she will call us and we will cut      back her metoprolol to 25 mg a day.  If  she has recurrent or      worsening palpitations, she is to call us and we will see her back      sooner.      Tereso Newcomer, PA-C  Electronically Signed      Learta Codding, MD,FACC  Electronically Signed   SW/MedQ  DD: 08/20/2007  DT: 08/20/2007  Job #: [id number]

## 2010-10-14 NOTE — Op Note (Signed)
[name] [name], [name] [name] NO.:  [id number]   MEDICAL RECORD NO.:  [id number]          PATIENT TYPE:  INP   LOCATION:  0004                         FACILITY:  [facility]   PHYSICIAN:  Ollen Gross, M.D.    DATE OF BIRTH:  21-Jul-1927   DATE OF PROCEDURE:  DATE OF DISCHARGE:                                 OPERATIVE REPORT   PREOPERATIVE DIAGNOSIS:  Loose unstable right total knee arthroplasty.   POSTOPERATIVE DIAGNOSIS:  Loose unstable right total knee arthroplasty.   PROCEDURE:  Right total knee arthroplasty revision.   SURGEON:  Ollen Gross, M.D.   ASSISTANT:  Alexzandrew L. Julien Girt, P.A.   ANESTHESIA:  General with postop Marcaine pain pump.   ESTIMATED BLOOD LOSS:  Minimal.   DRAIN:  Hemovac x1.   TOURNIQUET TIME:  46 minutes at 300 mmHg, down 15 minutes, and up an  additional 28 minutes at 300 mmHg.   COMPLICATIONS:  None.   CONDITION:  Stable to recovery.   BRIEF CLINICAL NOTE:  [name] [name] is a 77 year old female, who had a  previous unicompartmental knee replacement done several years ago, which was  painful and then converted to a total knee arthroplasty.  She had quite a  bit of pain and instability.  A bone scan did show loosening of the tibial  component.  In addition, she has significant instability.  At this point,  due to the significant pain, she presents now for a right total knee  arthroplasty revision.   PROCEDURE IN DETAIL:  At the successful administration of a general  anesthetic, a tourniquet was placed high on the right thigh and the right  lower extremity was prepped and draped in the usual sterile fashion.  Exam  under anesthesia was done and on full extension, she has got about 3-5 mm of  varus and valgus laxity, which is worse in flexion.  The right lower  extremity was wrapped in Esmarch, the knee flexed, and the tourniquet  inflated 300 mmHg.  A midline incision was made with a 10 blade through the  subcutaneous tissue to  the level of the extensor mechanism.  A fresh blade  was used to make a medial parapatellar arthrotomy.  We did not encounter any  fluid in the joint.  The tissues appeared very healthy.  All scar tissue is  excised and then the soft tissue of the proximal medial tibia was  subperiosteally elevated to the joint line with the knife and into the  semimembranosus bursa with a Cobb elevator.  Laterally, we elevated tissue  with attention being paid to avoid her patellar tendon on the tibial  tubercle.  Her patella was everted and the knee flexed to approximately 90  degrees.  At 90 degrees flexion, the tibia was subluxing quite a bit  anteriorly on the femur.  We then removed the tibial polyethylene, which was  a mobile bearing tibial polyethylene.  I then used an osteotome to disrupt  the interface between bone and cement and the component on the femoral side.  The femoral component was easily removed with minimal bone loss.  On the  tibial side, she has a 10 mm wedge medially, and the tibial component is  very loose and easily removed with minimal disruption of the interface.  We  then thoroughly irrigated both the tibial and femoral canal after  establishing the canal with a starter reamer.  On the femoral side, we  reamed up to 16 mm, and on the tibial side, 13 mm.  We used the  extramedullary tibial guide and referencing proximally at the medial aspect  of the tibial tubercle and distally along the second metatarsal axis tibial  crest.  I pinned it to remove minimal bone off of the lateral side.  We then  placed the step wedge cutting block to remove minimal bone on the medial  side, so as to use a 5 mm wedge.  Given that the resection level was already  fairly low on the usual surgery, I decided to build up the lateral side by 5  mm and the medial side by 10 mm, by placing the appropriate-sized augments  on each.  We placed a 2.5 tray up to the tibia and that was the most  suitable size.   We then prepared the proximal tibia with the modular drill.  I placed a tibial trial of a size 2.5 MBT revision tray with a 5 mm lateral  augment and 10 mm medial augment.  This had excellent fit on the tibia and  did restore her to a more normal height.   We then placed the 16 mm reamer into the femoral canal and placed the distal  femoral cutting block and 5 degrees of valgus.  We resected minimal bone off  of the lateral side.  On the medial side, I had to go up to the 4 mm cut in  order to get any bone and all.  Thus, we placed a 4 mm distal medial augment  on the trials.  I then placed the AP cutting block for a size 3, which was  the most appropriate size.  We placed this in a +2 position to effectively  raise the stem and lower the femur, so would it not be proud anteriorly.  I  placed a 15 mm spacer block to gauge our rotation on the femoral side and  this was done at 90 degrees of flexion to create a rectangular flexion gap.  The block is pinned and there is no bone resection anteriorly and minimal  resection posteriorly, so we had to go to the 4 mm slot posteriorly and then  placed medial and lateral 4 mm posterior augments.  I then reused the  revision block for the intercondylar cut for the TC3.  The trial femoral  component is then set up with the size 3 TC3 posterior stabilized femur, a  +2 bolt, a 16-mm x 125 stem in the +2 position, with a 5 mm distal medial  augment, and a 5 mm posterior augment, both medial and lateral.  On the  tibial side, once again, we used the trial as above.  We then placed a 15 mm  TC3 RP trial insert.  Full extension was achieved with excellent balance  throughout full range of motion.  I inspected the patella and this appeared  well fixed.  There was a  tremendous amount of soft tissue overhanging the  patella, as well as bones.  We did a patellaplasty to uncover the component, and this way would articulate with the component, as opposed to  articulating  with either bone or soft tissue.  We then released the tourniquet for total  time of 46 minutes.  While the tourniquet was down, I assembled all of the  components on the back table.  Approximately 14 to 15 minutes later, we  rewrapped the leg in Esmarch and reinflated the tourniquet.  We then placed  the trial cement restricter and a size 3 was the most appropriate.  The  permanent size 3 cement restricter was then placed into the tibial canal.  The cut bone surfaces and then thoroughly prepared a pulsatile lavage.  I  mixed three batches of cement with 2 grams of vancomycin.  The cement, once  ready, was injected into the tibial canal and pressurized.  The tibial  component was then cemented into place and then on the femoral side, we  Press-Fit the stem and cemented distally.  On the tibial side, we had  cemented the stem also.  We placed the trial 15 TC3 insert and held the knee  in full extension and all extruded cement was removed.  With the 15, there  was a tiny bit of varus valgus. This was replaced with a trial of 17.5,  which had fantastic stability throughout.  We then placed the permanent 17.5  mm TC3 rotating platform insert into the tibial tray.  The wound was  copiously irrigated with saline solution and the extensor mechanism was then  closed over a Hemovac drain with interrupted #1 PDS.  Flexion against  gravity was 125 degrees.  The subcu was closed with interrupted 2-0 Vicryl  and then we released the tourniquet for a second  time at 28 minutes.  The skin was closed with staples.  A catheter for a  Marcaine pain pump was placed and the pump was initiated.  The drain was  hooked to suction and a bulky sterile dressing was applied.  She was placed  into a knee immobilizer, awakened, and transported to recovery in stable  condition.      Ollen Gross, M.D.  Electronically Signed     FA/MEDQ  D:  09/06/2005  T:  09/06/2005  Job:  [id number]

## 2010-10-14 NOTE — Op Note (Signed)
[name] [name], [name] [name]              ACCOUNT NO.:  [id number]   MEDICAL RECORD NO.:  [id number]          PATIENT TYPE:  AMB   LOCATION:  DAY                          FACILITY:  [facility]   PHYSICIAN:  Ollen Gross, M.D.    DATE OF BIRTH:  Apr 30, 1928   DATE OF PROCEDURE:  12/06/2005  DATE OF DISCHARGE:                                 OPERATIVE REPORT   PREOPERATIVE DIAGNOSIS:  Arthrofibrosis, right knee.   POSTOPERATIVE DIAGNOSIS:  Arthrofibrosis, right knee.   PROCEDURE:  Right knee closed manipulation.   SURGEON:  Ollen Gross, M.D.   ASSISTANT:  None.   ANESTHESIA:  General.   COMPLICATIONS:  None.   CONDITION:  Stable to recovery.   Premanipulation range of motion 5-85, post manipulation range of motion 0 to  125.   BRIEF CLINICAL NOTE:  [name] is a 77 year old female who had a right  total knee arthroplasty revision performed a couple of months ago.  She has  had a very stiff knee postop and has not improved range of motion with  physical therapy over the past month.  She presents now for closed  manipulation.   PROCEDURE IN DETAIL:  After the successful administration of general  anesthetic, exam under anesthesia was performed.  Range was about 5 to 85  with flexion against gravity.  I subsequently placed my chest on her  proximal tibia and I gently flexed the knee with the adhesions audibly  lysing.  I was able to easily get her to 125 degrees of flexion and then I  was able to get full extension with improved patellar mobility.  I then  flexed her against gravity and it was still 125 degrees of flexion.  She  subsequently awakened and was transported to the recovery room in stable  condition.      Ollen Gross, M.D.  Electronically Signed     FA/MEDQ  D:  12/06/2005  T:  12/06/2005  Job:  [id number]

## 2010-10-14 NOTE — Discharge Summary (Signed)
[name], [name]              ACCOUNT NO.:  [id number]   MEDICAL RECORD NO.:  [id number]          PATIENT TYPE:  INP   LOCATION:  1518                         FACILITY:  [facility]   PHYSICIAN:  Ollen Gross, M.D.    DATE OF BIRTH:  08/23/1927   DATE OF ADMISSION:  09/06/2005  DATE OF DISCHARGE:  09/11/2005                                 DISCHARGE SUMMARY   ADMISSION DIAGNOSES:  1.  Loose right total knee arthroplasty.  2.  Glaucoma, left eye.  3.  History of rosacea.  4.  Urinary incontinence.  5.  Degenerative joint disease.  6.  History of T10 compression fracture.  7.  Kyphosis.  8.  Scoliosis.  9.  History of hemorrhoids.   DISCHARGE DIAGNOSES:  1.  Loose unstable right total knee, status post right total knee      arthroplasty revision.  2.  Glaucoma, left eye.  3.  History of rosacea.  4.  Urinary incontinence.  5.  Degenerative joint disease.  6.  History of T10 compression fracture.  7.  Kyphosis.  8.  Scoliosis.  9.  History of hemorrhoids.  10. Postoperative blood loss anemia.  11. Postoperative hyponatremia.  12. Postoperative hypokalemia.   PROCEDURE:  On September 06, 2005, right total knee arthroplasty revision.   SURGEON:  Ollen Gross, M.D.   ASSISTANT:  Alexzandrew L. Perkins, P.A.-C.   ANESTHESIA:  General.  Postoperative pain pump.   CONSULTATIONS:  None.   BRIEF HISTORY:  [name] [name] is a 77 year old female with a previous  unicompartmental knee replacement done several years ago which was painful  and converted over to a normal total knee.  Bone scan has shown loosening of  the tibial component.  She has significant instability at this point due to  pain.  It is felt she would benefit from undergoing revision surgery.  Subsequently admitted to the hospital.   LABORATORY DATA:  Preoperative CBC:  Hemoglobin of 14.6, hematocrit 42.9,  white cell count 6.  Postoperative hemoglobin down to 11.7, then drifted  down to 9.8.  Last noted H&H 9 and  26.7.  PT and PTT preoperatively 13.1 and  30, respectively.  Serial prothrombin times and INRs were followed.  Last  noted PT/INR were 23.8 and 2.1.  Chem panel on admission did show some low  potassium at 3.3.  Remaining chem panel all within normal limits.  Serial  BMETs were followed.  Sodium dropped from 135 to 133, back up to 135, then  132.  Potassium went up to 3.7, back down to 3.4, back up to 3.5.  Remaining  electrolytes remained within normal limits.  Hemoglobin A1C taken one time  on September 10, 2005, was 6.2, minimally elevated.  Preoperative UA showed  small hemoglobin, 0 to 2 red cells, otherwise negative.  Blood group type AB  positive.  EKG dated August 30, 2005, normal sinus rhythm, left axis  deviation, moderate voltage criteria for LVH is confirmed by Dr. Charolette Child.  A two view chest on August 30, 2005, mild cardiomegaly, no acute  findings.   HOSPITAL COURSE:  The patient was admitted to [facility],  tolerated procedure well, later transferred to recovery room and then the  orthopedic floor for postoperative care.  Started on PCA and p.o. analgesics  for pain control.  Following surgery, did have some pain in the leg on day  #1.  Briefly discussed surgical findings.  Hemovac drain placed at time of  surgery was pulled on day #1.  Excellent output on the evening of surgery,  but had low urine output since midnight on the morning of day #1.  Had some  low sodium.  Underwent fluid boluses.  The urine output improved very well,  responded to the IV fluids.  By the next day, doing pretty well with decent  pain control.  The pills were making her a little nauseated, and switched  over to Vicodin.  Dressing was changed, incision looked good.  Sodium was  back up to 136.  PCA, Foley, and IV's were discontinued.  She had excellent  output again, responded to the fluid boluses.  By day #3, she was doing  well.  Dressing was changed.  She started getting up with  physical therapy.  Did have some elevated serum glucose.  Hemoglobin A1C was checked which was  just borderline elevated.  Started again with physical therapy and ambulated  approximately 60 feet.  Was slow to progress, but was progressing with her  therapy.  She got up to about 140 feet the following day.  Hemoglobin was  down to 9, but she was asymptomatic.  By the following day of day #5, she  had been seen back in rounds by Dr. Lequita Halt, she was doing well, progressing  with physical therapy, no complaints, moving her bowels, and was ready to go  home.   DISCHARGE PLANS:  The patient was discharged home on September 11, 2005.   DISCHARGE DIAGNOSES:  Please see above.   DISCHARGE MEDICATIONS:  1.  Coumadin.  2.  Robaxin.  3.  Vicodin.   DIET:  As tolerated.   FOLLOWUP:  Follow up next Friday, call the office for an appointment at [phone]  [phone].   ACTIVITY:  Weightbearing as tolerated.  Home health PT, home health nursing,  total knee protocol.   DISPOSITION:  Home   CONDITION ON DISCHARGE:  Improved.      Alexzandrew L. Julien Girt, P.A.      Ollen Gross, M.D.  Electronically Signed    ALP/MEDQ  D:  10/20/2005  T:  10/21/2005  Job:  [id number]   cc:   Nena Jordan  [address]  [place]  [place] [address]

## 2010-10-14 NOTE — H&P (Signed)
NAME:  [name] [name], [name] [name] NO.:  [id number]   MEDICAL RECORD NO.:  [id number]          PATIENT TYPE:  INP   LOCATION:  NA                           FACILITY:  [facility]   PHYSICIAN:  Ollen Gross, M.D.    DATE OF BIRTH:  December 10, 1927   DATE OF ADMISSION:  09/06/2005  DATE OF DISCHARGE:                                HISTORY & PHYSICAL   Date of office visit, history and physical August 22, 2005.   CHIEF COMPLAINT:  A loose right total knee arthroplasty.   HISTORY:  The patient is a 77 year old female who has been seen by Dr.  Lequita Halt for a loose right total knee arthroplasty.  The patient initially  underwent a unicompartmental replacement knee and had to be converted over  to a total knee replacement arthroplasty.  This was done in [place].  She has been followed by Dr. Lequita Halt for quite some time with a painful  total knee.  She had a bone scan in the past which did show some potential  loosening.  She has had constant pain since that time that tends to get  worse with activity.  It is at a stage now where she would like to go ahead  and get that revised.  X-rays show the total knee in good position with no  definitive paraprosthetic abnormalities but she has loosening noted on bone  scan.  It is felt that she would benefit from undergoing complete revision.  Risks and benefits have been discussed with the patient and she elected to  proceed with surgery.   ALLERGIES:  No known drug allergies.   INTOLERANCES:  KEFLEX causes nausea and vomiting.   CURRENT MEDICATIONS:  Actonel, Klor-Con, chlorthalidone, Erythro tabs,  aspirin, calcium, multivitamin, stool softener, Timolol.   PAST MEDICAL HISTORY:  1.  Glaucoma left eye.  2.  History of rosacea.  3.  History of T10 compression fracture.  4.  Scoliosis and kyphosis.  5.  Left ankle fracture.  6.  Degenerative disc disease.  7.  History of hemorrhoids.   PAST SURGICAL HISTORY:  1.  Two previous knee  surgeries consisting of a unicompartmental replacement      and then conversion over to a total knee replacement.  2.  Hysterectomy.  3.  Rectocele and left ankle surgery.   SOCIAL HISTORY:  Married, retired, nonsmoker, no alcohol, two children.   FAMILY HISTORY:  Mother with a history of MI, deceased at age 24.   REVIEW OF SYSTEMS:  GENERAL:  No fever, chills, night sweats.  NEUROLOGICAL:  No seizure, syncope, or paralysis.  Does have history of glaucoma in the  left eye.  RESPIRATORY:  No shortness of breath, productive cough or  hemoptysis.  CARDIOVASCULAR:  No chest pain, angina, or orthopnea.  GI:  No  nausea, vomiting, diarrhea or constipation.  GU:  A little bit of urinary  incontinence and urgency.  No dysuria, hematuria.  MUSCULOSKELETAL:  Right  knee.   PHYSICAL EXAMINATION:  VITAL SIGNS:  Pulse 72, respirations 12, blood  pressure 142/88.  GENERAL:  A 77 year old white female well-developed, well-nourished,  short  in stature.  Alert, oriented, cooperative and very pleasant.  HEENT:  Normocephalic and atraumatic.  Pupils round and reactive.  Oropharynx clear.  EOMs intact.  Lower partial plate is noted.  NECK:  Supple.  CHEST:  Clear.  Does have a kyphotic and a thoracic scoliotic spine.  Anterior and posterior chest walls are clear to auscultation.  HEART:  Regular rate and rhythm without murmur.  S1 and S2.  ABDOMEN:  Soft and nontender.  Bowel sounds present.  RECTAL:  Not done, but not pertinent to present illness.  BREASTS:  Not done, but not pertinent to present illness.  GENITALIA:  Not done, but not pertinent to present illness.  EXTREMITIES:  Right knee has previous anterior knee scars noted.  No  effusion.  Range of motion 5 to 115.  Tender on palpation.   IMPRESSION:  1.  Loose right total knee arthroplasty.  2.  Glaucoma left eye.  3.  History of rosacea.  4.  Urinary incontinence.  5.  Degenerative disc disease.  6.  History of thoracic vertabrae-10  compression fracture.  7.  Kyphosis.  8.  Scoliosis.  9.  History of hemorrhoids.   PLAN:  The patient will be admitted to [facility] and undergo  revision right total knee arthroplasty.  Surgery will be performed by Dr.  Ollen Gross.      Alexzandrew L. Julien Girt, P.A.      Ollen Gross, M.D.  Electronically Signed    ALP/MEDQ  D:  09/05/2005  T:  09/05/2005  Job:  [id number]   cc:   Nena Jordan  [address]  [place]  [place] [address]   Ollen Gross, M.D.  Fax: [phone]

## 2010-11-04 ENCOUNTER — Encounter: Payer: Self-pay | Admitting: Cardiology

## 2010-11-10 ENCOUNTER — Ambulatory Visit (INDEPENDENT_AMBULATORY_CARE_PROVIDER_SITE_OTHER): Payer: Medicare Other | Admitting: Cardiology

## 2010-11-10 ENCOUNTER — Encounter: Payer: Self-pay | Admitting: Cardiology

## 2010-11-10 VITALS — BP 129/77 | HR 51 | Ht 61.0 in | Wt 148.0 lb

## 2010-11-10 DIAGNOSIS — I4891 Unspecified atrial fibrillation: Secondary | ICD-10-CM

## 2010-11-10 MED ORDER — ATENOLOL 50 MG PO TABS: 50.0000 mg | ORAL_TABLET | Freq: Every day | ORAL | Status: DC

## 2010-11-10 NOTE — Progress Notes (Signed)
HPI The patient is a 77 year old female with a history of paroxysmal atrial fibrillation. She has no history of significant coronary artery disease. She had a negative ischemia workup in 2009. During the last office we switched her to atenolol. She also complains of dry mouth with metoprolol. Seem to have improved. She still reports occasional palpitations although they're very rare. She was concerned about 2 episodes that occurred in March within 2 weeks of apart during the nighttime. However the patient did not take any extra metoprolol. There was no associated shortness of breath or chest pain. She reports no orthopnea PND. Otherwise from a cardiac perspective she is doing well.  Allergies  Allergen Reactions  . Cephalexin     REACTION: nausea  . Digoxin     REACTION: rash on legs    Current Outpatient Prescriptions on File Prior to Visit  Medication Sig Dispense Refill  . amitriptyline (ELAVIL) 25 MG tablet Take 12.5 mg by mouth at bedtime as needed.        Marland Kitchen CALCIUM-VITAMIN D PO Take by mouth 2 (two) times daily.        Marland Kitchen docusate sodium (COLACE) 100 MG capsule Take 100 mg by mouth at bedtime. Dosage not specified       . erythromycin (ERY-TAB) 333 MG EC tablet Take 333 mg by mouth 2 (two) times daily.        . furosemide (LASIX) 20 MG tablet Take 20 mg by mouth daily as needed.        . metoprolol succinate (TOPROL-XL) 25 MG 24 hr tablet Take 25 mg by mouth daily as needed.        . metroNIDAZOLE (METROCREAM) 0.75 % cream Apply 1 application topically daily.        . potassium chloride SA (K-DUR,KLOR-CON) 20 MEQ tablet Take 20 mEq by mouth daily.        . Psyllium (METAMUCIL) 30.9 % POWD Take by mouth daily.        Marland Kitchen warfarin (COUMADIN) 5 MG tablet Take 5 mg by mouth daily. UAD.       Marland Kitchen DISCONTD: atenolol (TENORMIN) 50 MG tablet Take 50 mg by mouth daily.        Marland Kitchen DISCONTD: desoximetasone (TOPICORT) 0.25 % cream Apply 1 application topically daily as needed.        Marland Kitchen DISCONTD:  Hydrocortisone Probutate (PANDEL) 0.1 % CREA Apply 1 application topically daily.        Marland Kitchen DISCONTD: risedronate (ACTONEL) 35 MG tablet Take 35 mg by mouth every 7 (seven) days. with water on empty stomach, nothing by mouth or lie down for next 30 minutes.         Past Medical History  Diagnosis Date  . Atrial fibrillation     paroxysmal; CHADS2 of two  . DM (diabetes mellitus)   . Nonischemic cardiomyopathy 2/09    lytr study   . Osteoarthritis   . Pedal edema     chronic  . Fatigue   . Rosacea     No past surgical history on file.  No family history on file.  History   Social History  . Marital Status: Widowed    Spouse Name: N/A    Number of Children: N/A  . Years of Education: N/A   Occupational History  . Not on file.   Social History Main Topics  . Smoking status: Never Smoker   . Smokeless tobacco: Never Used   Comment: does not smoke.   Marland Kitchen Alcohol  Use: No  . Drug Use: Not on file  . Sexually Active: Not on file   Other Topics Concern  . Not on file   Social History Narrative   Widowed. (medical history: coumadin therapy ; follow-up by Dr. Sherril Croon )    JXB:[id number] positives as outlined above. The remainder of the 18  point review of systems is negative  PHYSICAL EXAM BP 129/77  Pulse 51  Ht 5\' 1"  (1.549 m)  Wt 148 lb (67.132 kg)  BMI 27.96 kg/m2  General: Well-developed, well-nourished in no distress Head: Normocephalic and atraumatic Eyes:PERRLA/EOMI intact, conjunctiva and lids normal Ears: No deformity or lesions Mouth:normal dentition, normal posterior pharynx Neck: Supple, no JVD.  No masses, thyromegaly or abnormal cervical nodes Lungs: Normal breath sounds bilaterally without wheezing.  Normal percussion Cardiac: regular rate and rhythm with normal S1 and S2, no S3 or S4.  PMI is normal.  No pathological murmurs Abdomen: Normal bowel sounds, abdomen is soft and nontender without masses, organomegaly or hernias noted.  No  hepatosplenomegaly MSK: Back normal, normal gait muscle strength and tone normal Vascular: Pulse is normal in all 4 extremities Extremities: No peripheral pitting edema Neurologic: Alert and oriented x 3 Skin: Intact without lesions or rashes Lymphatics: No significant adenopathy Psychologic: Normal affect  ECG: Normal sinus rhythm no acute ischemic changes  ASSESSMENT AND PLAN

## 2010-11-10 NOTE — Assessment & Plan Note (Signed)
No evidence of recurrent persistent atrial fibrillation. The patient appears to have had a couple of episodes of paroxysms. At this point they're rare enough that I do not think that she needs to go on antiarrhythmic therapy. We did have a brief discussion about this. For now we'll continue atenolol and metoprolol when necessary if needed

## 2010-11-10 NOTE — Patient Instructions (Signed)
Continue all current medications. Your physician wants you to follow up in: 6 months.  You will receive a reminder letter in the mail one-two months in advance.  If you don't receive a letter, please call our office to schedule the follow up appointment   

## 2011-05-15 ENCOUNTER — Encounter: Payer: Self-pay | Admitting: Cardiology

## 2011-05-15 ENCOUNTER — Ambulatory Visit (INDEPENDENT_AMBULATORY_CARE_PROVIDER_SITE_OTHER): Payer: Medicare Other | Admitting: Cardiology

## 2011-05-15 VITALS — BP 147/79 | HR 48 | Ht 61.0 in | Wt 147.0 lb

## 2011-05-15 DIAGNOSIS — I4891 Unspecified atrial fibrillation: Secondary | ICD-10-CM

## 2011-05-15 DIAGNOSIS — Q211 Atrial septal defect: Secondary | ICD-10-CM

## 2011-05-15 DIAGNOSIS — F411 Generalized anxiety disorder: Secondary | ICD-10-CM

## 2011-05-15 DIAGNOSIS — Z7901 Long term (current) use of anticoagulants: Secondary | ICD-10-CM

## 2011-05-15 DIAGNOSIS — R0602 Shortness of breath: Secondary | ICD-10-CM

## 2011-05-15 DIAGNOSIS — Q2112 Patent foramen ovale: Secondary | ICD-10-CM

## 2011-05-15 MED ORDER — METOPROLOL TARTRATE 25 MG PO TABS: ORAL_TABLET | ORAL | Status: DC

## 2011-05-15 MED ORDER — ATENOLOL 50 MG PO TABS: 50.0000 mg | ORAL_TABLET | Freq: Every evening | ORAL | Status: DC

## 2011-05-15 NOTE — Assessment & Plan Note (Signed)
Controlled and followed by her primary care physician.

## 2011-05-15 NOTE — Assessment & Plan Note (Signed)
No clinically recurrent atrial fibrillation. I did ask the patient to take her atenolol in the evening as this may help with sensations of dizziness when she takes in the morning

## 2011-05-15 NOTE — Progress Notes (Signed)
Peyton Bottoms, MD, [facility] ABIM Board Certified in Adult Cardiovascular Medicine,Internal Medicine and Critical Care Medicine    CC: Routine followup of the patient with palpitations  HPI:  The patient is a 77 year old female with a history of paroxysmal atrial fibrillation but no coronary artery disease. She had a negative stress test in 2009. The patient's blood pressure is mildly elevated in the office but she states that her blood pressures normal at home. Sometimes she feels like her face gets particularly and she appears to be hyperventilated this is associated with lightheadedness. Overall though the patient states that she feels good and has had no chest pain on exertion palpitations presyncope or syncope. Her only new symptoms her some exertional shortness of breath although this appears to be mild. She does report that she feels dizzy after she takes her atenolol in the morning.  The patient reports no other cardiovascular symptoms.     PMH: reviewed and listed in Problem List in Electronic Records (and see below) Past Medical History  Diagnosis Date  . Atrial fibrillation     paroxysmal; CHADS2 of two  . DM (diabetes mellitus)   . Nonischemic cardiomyopathy 2/09    lytr study   . Osteoarthritis   . Pedal edema     chronic  . Fatigue   . Rosacea    No past surgical history on file.   Allergies/SH/FHX : available in Electronic Records for review and reviewed  Medications: Current Outpatient Prescriptions  Medication Sig Dispense Refill  . atenolol (TENORMIN) 50 MG tablet Take 1 tablet (50 mg total) by mouth every evening.      Marland Kitchen CALCIUM-VITAMIN D PO Take by mouth 2 (two) times daily.        Marland Kitchen docusate sodium (COLACE) 100 MG capsule Take 100 mg by mouth at bedtime. Dosage not specified       . erythromycin (ERY-TAB) 333 MG EC tablet Take 333 mg by mouth 2 (two) times daily.        . furosemide (LASIX) 20 MG tablet Take 20 mg by mouth daily as needed.        .  metroNIDAZOLE (METROCREAM) 0.75 % cream Apply 1 application topically daily.        . Multiple Vitamin (MULTIVITAMIN) tablet Take 1 tablet by mouth daily.        . potassium chloride SA (K-DUR,KLOR-CON) 20 MEQ tablet Take 20 mEq by mouth daily as needed.       . Psyllium (METAMUCIL) 30.9 % POWD Take by mouth daily.        . risedronate (ACTONEL) 35 MG tablet Take 35 mg by mouth every 7 (seven) days. with water on empty stomach, nothing by mouth or lie down for next 30 minutes.       . warfarin (COUMADIN) 5 MG tablet Take 5 mg by mouth daily. UAD.       Marland Kitchen metoprolol tartrate (LOPRESSOR) 25 MG tablet Take 1/2 tab (12.5mg ) as needed for palpitations  15 tablet  3    ROS: No nausea or vomiting. No fever or chills.No melena or hematochezia.No bleeding.No claudication  Physical Exam: BP 147/79  Pulse 48  Ht 5\' 1"  (1.549 m)  Wt 147 lb (66.679 kg)  BMI 27.78 kg/m2 General: Well-nourished white female. In no distress Neck: Normal carotid upstroke and no carotid bruits. Lungs: Clear breath sounds bilaterally without wheezing. Cardiac: Regular rate and rhythm with normal S1-S2 no murmur rubs or gallops Vascular: No edema. Normal distal pulses  Skin: Warm and dry  12lead ECG: Sinus bradycardia with no acute changes  Limited bedside ECHO: Normal LV and RV systolic function. Trace aortic insufficiency, trace mitral regurgitation normal left atrial size. Small PFO with left to right shunt.  Assessment and Plan

## 2011-05-15 NOTE — Assessment & Plan Note (Signed)
Does not appear to be hemodynamically significant. Not associated with increase in RA or RV size.

## 2011-05-15 NOTE — Assessment & Plan Note (Signed)
No complications on Coumadin therapy.

## 2011-05-15 NOTE — Patient Instructions (Signed)
   Take Atenolol in the evening  Metoprolol 12.5mg  as needed for palpitations  Take 20mg  Lasix x 3 days only, then stop Your physician wants you to follow up in: 6 months.  You will receive a reminder letter in the mail one-two months in advance.  If you don't receive a letter, please call our office to schedule the follow up appointment

## 2011-08-29 ENCOUNTER — Other Ambulatory Visit: Payer: Self-pay | Admitting: Cardiology

## 2011-11-14 ENCOUNTER — Encounter: Payer: Self-pay | Admitting: *Deleted

## 2011-11-14 ENCOUNTER — Telehealth: Payer: Self-pay

## 2011-11-14 ENCOUNTER — Other Ambulatory Visit: Payer: Self-pay | Admitting: Cardiology

## 2011-11-14 ENCOUNTER — Encounter: Payer: Self-pay | Admitting: Cardiology

## 2011-11-14 ENCOUNTER — Ambulatory Visit (INDEPENDENT_AMBULATORY_CARE_PROVIDER_SITE_OTHER): Payer: Medicare Other | Admitting: Cardiology

## 2011-11-14 VITALS — BP 116/67 | HR 54 | Ht 61.0 in | Wt 144.8 lb

## 2011-11-14 DIAGNOSIS — Q211 Atrial septal defect: Secondary | ICD-10-CM

## 2011-11-14 DIAGNOSIS — M1990 Unspecified osteoarthritis, unspecified site: Secondary | ICD-10-CM | POA: Insufficient documentation

## 2011-11-14 DIAGNOSIS — R072 Precordial pain: Secondary | ICD-10-CM

## 2011-11-14 DIAGNOSIS — R079 Chest pain, unspecified: Secondary | ICD-10-CM | POA: Insufficient documentation

## 2011-11-14 DIAGNOSIS — R0602 Shortness of breath: Secondary | ICD-10-CM

## 2011-11-14 DIAGNOSIS — Z7901 Long term (current) use of anticoagulants: Secondary | ICD-10-CM

## 2011-11-14 DIAGNOSIS — I4891 Unspecified atrial fibrillation: Secondary | ICD-10-CM

## 2011-11-14 MED ORDER — ACETAMINOPHEN 500 MG PO TABS: 1000.0000 mg | ORAL_TABLET | Freq: Four times a day (QID) | ORAL | Status: AC | PRN

## 2011-11-14 NOTE — Assessment & Plan Note (Signed)
The patient reports a single episode of prolonged palpitations which lasted most of the night and part of the daytime. She had no associated shortness of breath or chest pain. She did take metoprolol 12.5 mg with no improvement in symptoms. I recommended to the patient that She takes 25 mg of metoprolol a still palpitations after 30 minutes to an hour she can take a second dose. Her last office visit we performed an bedside echocardiogram which demonstrated normal LV function as well as RV function, albeit with a small PFO. Other than this one episode the patient has had no recurrent atrial fibrillation.

## 2011-11-14 NOTE — Assessment & Plan Note (Signed)
The patient has severe pain in her joints particularly in her hands. She's concerned taking any pain medications with Coumadin. However I recommended that she can take Tylenol up to 1000 mg every 6 hours as needed for osteoarthritic pain.
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
## 2011-11-14 NOTE — Assessment & Plan Note (Signed)
Patient reports 3 separate episodes of brief substernal chest pain. She does not report any exertional chest pain. We will continue to monitor this but I do not think the patient needs stress test at this point.

## 2011-11-14 NOTE — Assessment & Plan Note (Signed)
Patient remains compliant with warfarin therapy.

## 2011-11-14 NOTE — Telephone Encounter (Signed)
Dobutamine Echo Stress scheduled for 11-20-2011 @ [facility] Checking percert

## 2011-11-14 NOTE — Assessment & Plan Note (Signed)
Noted on prior bedside echocardiogram with normal RV size. Incidental finding not hemodynamically significant.

## 2011-11-14 NOTE — Assessment & Plan Note (Signed)
The patient has some exertional dyspnea but this has remained stable. She has no history of significant coronary artery disease.

## 2011-11-14 NOTE — Patient Instructions (Signed)
   Dobutamine stress echo  Our office will notify of results  Tylenol 1000mg  every 6 hours as needed for arthritic pain  Your physician wants you to follow up in:  1 year.  You will receive a reminder letter in the mail one-two months in advance.  If you don't receive a letter, please call our office to schedule the follow up appointment

## 2011-11-16 NOTE — Telephone Encounter (Signed)
No precert required per benefits fax

## 2011-11-20 DIAGNOSIS — R072 Precordial pain: Secondary | ICD-10-CM

## 2011-12-06 ENCOUNTER — Telehealth: Payer: Self-pay | Admitting: *Deleted

## 2011-12-06 NOTE — Telephone Encounter (Signed)
Message copied by Eustace Moore on Wed Dec 06, 2011  8:23 AM ------      Message from: Rande Brunt      Created: Tue Dec 05, 2011 10:55 AM       Neg Dob stress echo test

## 2011-12-06 NOTE — Telephone Encounter (Signed)
Patient informed. 

## 2012-04-29 ENCOUNTER — Other Ambulatory Visit: Payer: Self-pay | Admitting: Cardiology

## 2012-08-22 ENCOUNTER — Encounter: Payer: Self-pay | Admitting: Cardiology

## 2012-08-23 ENCOUNTER — Encounter: Payer: Self-pay | Admitting: Cardiology

## 2012-08-23 ENCOUNTER — Ambulatory Visit (INDEPENDENT_AMBULATORY_CARE_PROVIDER_SITE_OTHER): Payer: Medicare Other | Admitting: Cardiology

## 2012-08-23 VITALS — BP 152/73 | HR 57 | Ht 62.0 in | Wt 151.1 lb

## 2012-08-23 DIAGNOSIS — I4891 Unspecified atrial fibrillation: Secondary | ICD-10-CM

## 2012-08-23 DIAGNOSIS — Q211 Atrial septal defect: Secondary | ICD-10-CM

## 2012-08-23 NOTE — Assessment & Plan Note (Signed)
Paroxysmal, presently in sinus rhythm. Continue current medical regimen for intermittent palpitations. She reports no major bleeding issues with Coumadin.

## 2012-08-23 NOTE — Patient Instructions (Signed)
Continue all current medications. Your physician wants you to follow up in: 6 months.  You will receive a reminder letter in the mail one-two months in advance.  If you don't receive a letter, please call our office to schedule the follow up appointment   

## 2012-08-23 NOTE — Assessment & Plan Note (Signed)
Incidentally noted by previous bedside hand-held echocardiogram per Dr. DeGent. No RV enlargement. Not of any clinical significance at this point. 

## 2012-08-23 NOTE — Progress Notes (Signed)
Clinical Summary [name] [name] is a 79 y.o.female presenting for followup. She is a former patient of Dr. Andee [name], last seen in June 2013. She reports intermittent palpitations, reports no progression in her symptoms. She takes atenolol regularly and uses a small dose of Lopressor as needed for particularly bothersome palpitations. She is not reporting any exertional chest pain or unusual breathlessness.  Dobutamine echocardiogram in June of last year demonstrated equivocal ST segment changes, bursts of AF and also NSVT, negative echocardiographic images for ischemia.  ECG today shows normal sinus rhythm with poor R wave progression.   Allergies  Allergen Reactions  . Cephalexin     REACTION: nausea  . Digoxin     REACTION: rash on legs    Current Outpatient Prescriptions  Medication Sig Dispense Refill  . atenolol (TENORMIN) 50 MG tablet TAKE 1 TABLET BY MOUTH EVERY DAY - EMERGENCY REFILL FAXED DR  30 tablet  6  . CALCIUM-VITAMIN D PO Take by mouth 2 (two) times daily.        Marland Kitchen docusate sodium (COLACE) 100 MG capsule Take 100 mg by mouth at bedtime. Dosage not specified       . doxycycline (DORYX) 100 MG DR capsule Take 100 mg by mouth daily.      . furosemide (LASIX) 20 MG tablet Take 20 mg by mouth daily as needed.        . metoprolol tartrate (LOPRESSOR) 25 MG tablet Take 1/2 tab (12.5mg ) as needed for palpitations  15 tablet  3  . metroNIDAZOLE (METROCREAM) 0.75 % cream Apply 1 application topically daily.        . Multiple Vitamin (MULTIVITAMIN) tablet Take 1 tablet by mouth daily.        . Psyllium (METAMUCIL) 30.9 % POWD Take by mouth daily.        Marland Kitchen warfarin (COUMADIN) 5 MG tablet Take 5 mg by mouth daily. Managed by Dr. Leandrew [name].  UAD.       No current facility-administered medications for this visit.    Past Medical History  Diagnosis Date  . Atrial fibrillation     Paroxysmal; CHADS2 of two  . Type 2 diabetes mellitus   . History of cardiovascular stress test    Negative dobutamine echo 6/13  . Osteoarthritis   . Chronic edema   . Rosacea   . PFO (patent foramen ovale)     Social History [name] [name] reports that she has never smoked. She has never used smokeless tobacco. [name] [name] reports that she does not drink alcohol.  Review of Systems Reports low back pain recently. Otherwise negative.  Physical Examination Filed Vitals:   08/23/12 1339  BP: 152/73  Pulse: 57   Filed Weights   08/23/12 1339  Weight: 151 lb 1.9 oz (68.548 kg)   No acute distress. HEENT: Conjunctiva and lids normal, oropharynx clear. Neck: Supple, no elevated JVP or carotid bruits, no thyromegaly. Lungs: Clear to auscultation, nonlabored breathing at rest. Cardiac: Regular rate and rhythm, no S3 or significant systolic murmur, no pericardial rub. Abdomen: Soft, nontender, bowel sounds present. Extremities: No pitting edema, distal pulses 2+.   Problem List and Plan   ATRIAL FIBRILLATION Paroxysmal, presently in sinus rhythm. Continue current medical regimen for intermittent palpitations. She reports no major bleeding issues with Coumadin.  Patent foramen ovale Incidentally noted by previous bedside hand-held echocardiogram per Dr. Andee [name]. No RV enlargement. Not of any clinical significance at this point.    [name] [name], M.D., F.A.C.C.

## 2012-10-31 ENCOUNTER — Ambulatory Visit: Payer: Medicare Other | Admitting: Cardiology

## 2013-03-17 ENCOUNTER — Encounter: Payer: Self-pay | Admitting: Cardiology

## 2013-03-17 ENCOUNTER — Ambulatory Visit (INDEPENDENT_AMBULATORY_CARE_PROVIDER_SITE_OTHER): Payer: Medicare Other | Admitting: Cardiology

## 2013-03-17 VITALS — BP 113/73 | HR 62 | Ht 62.0 in | Wt 146.0 lb

## 2013-03-17 DIAGNOSIS — I7300 Raynaud's syndrome without gangrene: Secondary | ICD-10-CM

## 2013-03-17 DIAGNOSIS — Q211 Atrial septal defect: Secondary | ICD-10-CM

## 2013-03-17 DIAGNOSIS — I4891 Unspecified atrial fibrillation: Secondary | ICD-10-CM

## 2013-03-17 DIAGNOSIS — Q2112 Patent foramen ovale: Secondary | ICD-10-CM

## 2013-03-17 NOTE — Assessment & Plan Note (Signed)
Patient currently indicates that she is satisfied with control of palpitations on current regimen which includes atenolol and Coumadin. She very rarely uses low-dose Lopressor for more prolonged symptoms. No changes were made today.

## 2013-03-17 NOTE — Assessment & Plan Note (Signed)
Apparent diagnosis, mainly affecting her toes in the cold months. Seems unlikely to be secondary to her beta blocker. Sometimes a calcium channel blocker can actually be used to manage Raynaud's phenomenon, although typically something like Norvasc which has more vasodilatory properties, although would not concurrently manage her atrial fibrillation in terms of rate control. She could potentially consider diltiazem CD instead of atenolol, although I am not certain how this would effectively manage her atrial fibrillation. If her symptoms worsen, we can always make a change

## 2013-03-17 NOTE — Progress Notes (Signed)
Clinical Summary Ms. Conradt is an 79 y.o.female last seen in March of this year. She presents for a routine visit. Still has intermittent palpitations but no prolonged symptoms, seems to be satisfied with her control of atrial fibrillation.  She states that she had a "bleed" involving her left eye since I saw her, was treated by an ophthalmologist in [place]. It sounds like she received a single dose of vitamin K and was off Coumadin for about 10 days. She is back on Coumadin without other problems, had to undergo no surgery or other intervention to her eye. She has had no other significant bleeding problems.  Dobutamine echocardiogram in June of last year demonstrated equivocal ST segment changes, bursts of AF and also NSVT, negative echocardiographic images for ischemia.  She mentions to me having trouble with what sounds like Raynaud's phenomenon involving her toes in the cold months. She indicates that she was told that this might be related to her beta blocker, however I am not entirely certain about this as a typical side effect.   Allergies  Allergen Reactions  . Cephalexin     REACTION: nausea  . Digoxin     REACTION: rash on legs    Current Outpatient Prescriptions  Medication Sig Dispense Refill  . atenolol (TENORMIN) 50 MG tablet TAKE 1 TABLET BY MOUTH EVERY DAY - EMERGENCY REFILL FAXED DR  30 tablet  6  . CALCIUM-VITAMIN D PO Take by mouth 2 (two) times daily.        Marland Kitchen docusate sodium (COLACE) 100 MG capsule Take 100 mg by mouth at bedtime. Dosage not specified       . doxycycline (DORYX) 100 MG DR capsule Take 100 mg by mouth daily.      . furosemide (LASIX) 20 MG tablet Take 20 mg by mouth daily as needed.        . metoprolol tartrate (LOPRESSOR) 25 MG tablet Take 1/2 tab (12.5mg ) as needed for palpitations  15 tablet  3  . metroNIDAZOLE (METROCREAM) 0.75 % cream Apply 1 application topically daily.        . Multiple Vitamin (MULTIVITAMIN) tablet Take 1 tablet  by mouth daily.        . Psyllium (METAMUCIL) 30.9 % POWD Take by mouth daily.        Marland Kitchen warfarin (COUMADIN) 5 MG tablet Take 5 mg by mouth daily. Managed by Dr. Leandrew Koyanagi.  UAD.       No current facility-administered medications for this visit.    Past Medical History  Diagnosis Date  . Atrial fibrillation     Paroxysmal; CHADS2 of two  . Type 2 diabetes mellitus   . History of cardiovascular stress test     Negative dobutamine echo 6/13  . Osteoarthritis   . Chronic edema   . Rosacea   . PFO (patent foramen ovale)     Social History Ms. Hlavac reports that she has never smoked. She has never used smokeless tobacco. Ms. Gashi reports that she does not drink alcohol.  Review of Systems Negative except as outlined.  Physical Examination Filed Vitals:   03/17/13 1118  BP: 113/73  Pulse: 62   Filed Weights   03/17/13 1118  Weight: 146 lb (66.225 kg)    No acute distress.  HEENT: Conjunctiva and lids normal, oropharynx clear.  Neck: Supple, no elevated JVP or carotid bruits, no thyromegaly.  Lungs: Clear to auscultation, nonlabored breathing at rest.  Cardiac: Regular rate and rhythm, no S3  or significant systolic murmur, no pericardial rub.  Abdomen: Soft, nontender, bowel sounds present.  Extremities: No pitting edema, distal pulses 2+.  Skin: Warm and dry. Musculoskeletal: No kyphosis. Neuropsychiatric: Alert and oriented x3, affect appropriate.   Problem List and Plan   Atrial fibrillation Patient currently indicates that she is satisfied with control of palpitations on current regimen which includes atenolol and Coumadin. She very rarely uses low-dose Lopressor for more prolonged symptoms. No changes were made today.  Raynaud's phenomenon Apparent diagnosis, mainly affecting her toes in the cold months. Seems unlikely to be secondary to her beta blocker. Sometimes a calcium channel blocker can actually be used to manage Raynaud's phenomenon, although  typically something like Norvasc which has more vasodilatory properties, although would not concurrently manage her atrial fibrillation in terms of rate control. She could potentially consider diltiazem CD instead of atenolol, although I am not certain how this would effectively manage her atrial fibrillation. If her symptoms worsen, we can always make a change  Patent foramen ovale Incidentally noted by previous bedside hand-held echocardiogram per Dr. Andee Lineman. No RV enlargement. Not of any clinical significance at this point.    Jonelle Sidle, M.D., F.A.C.C.

## 2013-03-17 NOTE — Assessment & Plan Note (Signed)
Incidentally noted by previous bedside hand-held echocardiogram per Dr. DeGent. No RV enlargement. Not of any clinical significance at this point. 

## 2013-03-17 NOTE — Patient Instructions (Signed)

## 2013-05-19 ENCOUNTER — Other Ambulatory Visit: Payer: Self-pay | Admitting: Cardiology

## 2013-05-19 MED ORDER — ATENOLOL 50 MG PO TABS: ORAL_TABLET | ORAL | Status: DC

## 2013-09-18 ENCOUNTER — Ambulatory Visit (INDEPENDENT_AMBULATORY_CARE_PROVIDER_SITE_OTHER): Payer: Medicare Other | Admitting: Cardiology

## 2013-09-18 ENCOUNTER — Encounter: Payer: Self-pay | Admitting: Cardiology

## 2013-09-18 VITALS — BP 159/79 | HR 54 | Ht 62.0 in | Wt 150.0 lb

## 2013-09-18 DIAGNOSIS — Q2112 Patent foramen ovale: Secondary | ICD-10-CM

## 2013-09-18 DIAGNOSIS — I4891 Unspecified atrial fibrillation: Secondary | ICD-10-CM

## 2013-09-18 DIAGNOSIS — Q2111 Secundum atrial septal defect: Secondary | ICD-10-CM

## 2013-09-18 DIAGNOSIS — Q211 Atrial septal defect: Secondary | ICD-10-CM

## 2013-09-18 NOTE — Progress Notes (Signed)
Clinical Summary [name] [name] is an 80 y.o.female last seen in October 2014. She has been relatively stable overall. States that she has had bouts of palpitations that can sometimes last for hours at a time, but these tend to come in spells separated by several weeks to months. She has not required any as needed extra beta blocker usage. She reports no bleeding problems with her Coumadin.  Dobutamine echocardiogram in June 2013 demonstrated equivocal ST segment changes, bursts of AF and also NSVT, negative echocardiographic images for ischemia. She is not reporting any anginal chest pain.   Allergies  Allergen Reactions  . Cephalexin     REACTION: nausea  . Digoxin     REACTION: rash on legs    Current Outpatient Prescriptions  Medication Sig Dispense Refill  . atenolol (TENORMIN) 50 MG tablet TAKE 1 TABLET BY MOUTH EVERY DAY - EMERGENCY REFILL FAXED DR  30 tablet  6  . CALCIUM-VITAMIN D PO Take 1 tablet by mouth daily.       Marland Kitchen. docusate sodium (COLACE) 100 MG capsule Take 100 mg by mouth at bedtime. Dosage not specified       . doxycycline (DORYX) 100 MG DR capsule Take 100 mg by mouth daily.      . furosemide (LASIX) 20 MG tablet Take 20 mg by mouth daily as needed.        . metoprolol tartrate (LOPRESSOR) 25 MG tablet Take 1/2 tab (12.5mg ) as needed for palpitations  15 tablet  3  . metroNIDAZOLE (METROCREAM) 0.75 % cream Apply 1 application topically daily.        . Multiple Vitamin (MULTIVITAMIN) tablet Take 1 tablet by mouth daily.        . Psyllium (METAMUCIL) 30.9 % POWD Take by mouth daily.        Marland Kitchen. warfarin (COUMADIN) 5 MG tablet Take 5 mg by mouth daily. Managed by Dr. Leandrew KoyanagiBurdine.  UAD.       No current facility-administered medications for this visit.    Past Medical History  Diagnosis Date  . Atrial fibrillation     Paroxysmal; CHADS2 of two  . Type 2 diabetes mellitus   . History of cardiovascular stress test     Negative dobutamine echo 6/13  . Osteoarthritis     . Chronic edema   . Rosacea   . PFO (patent foramen ovale)     Social History [name] [name] reports that she has never smoked. She has never used smokeless tobacco. [name] [name] reports that she does not drink alcohol.  Review of Systems Chronic knee pain due to arthritis, right knee replacement, also previous right hip fracture. Otherwise as outlined.  Physical Examination Filed Vitals:   09/18/13 1300  BP: 159/79  Pulse: 54   Filed Weights   09/18/13 1300  Weight: 150 lb (68.04 kg)    No acute distress.  HEENT: Conjunctiva and lids normal, oropharynx clear.  Neck: Supple, no elevated JVP or carotid bruits, no thyromegaly.  Lungs: Clear to auscultation, nonlabored breathing at rest.  Cardiac: Regular rate and rhythm, no S3 or significant systolic murmur, no pericardial rub.  Abdomen: Soft, nontender, bowel sounds present.  Extremities: No pitting edema, distal pulses 2+.  Skin: Warm and dry.  Musculoskeletal: No kyphosis.  Neuropsychiatric: Alert and oriented x3, affect appropriate.   Problem List and Plan   Atrial fibrillation Paroxysmal, continue current strategy medical therapy and anticoagulation.  Patent foramen ovale Incidentally noted by previous bedside hand-held echocardiogram per Dr.  DeGent. No RV enlargement. Not of any clinical significance at this point.    Jonelle SidleSamuel G. McDowell, M.D., F.A.C.C.

## 2013-09-18 NOTE — Patient Instructions (Signed)

## 2013-09-18 NOTE — Assessment & Plan Note (Signed)
Paroxysmal, continue current strategy medical therapy and anticoagulation.

## 2013-09-18 NOTE — Assessment & Plan Note (Signed)
Incidentally noted by previous bedside hand-held echocardiogram per Dr. Andee LinemaneGent. No RV enlargement. Not of any clinical significance at this point.

## 2014-01-14 ENCOUNTER — Other Ambulatory Visit: Payer: Self-pay | Admitting: Cardiology

## 2014-03-10 ENCOUNTER — Encounter: Payer: Self-pay | Admitting: Cardiology

## 2014-03-10 ENCOUNTER — Ambulatory Visit (INDEPENDENT_AMBULATORY_CARE_PROVIDER_SITE_OTHER): Payer: Medicare Other | Admitting: Cardiology

## 2014-03-10 VITALS — BP 157/77 | HR 51 | Ht 62.0 in | Wt 146.8 lb

## 2014-03-10 DIAGNOSIS — I480 Paroxysmal atrial fibrillation: Secondary | ICD-10-CM

## 2014-03-10 DIAGNOSIS — Q211 Atrial septal defect: Secondary | ICD-10-CM

## 2014-03-10 DIAGNOSIS — I739 Peripheral vascular disease, unspecified: Secondary | ICD-10-CM

## 2014-03-10 DIAGNOSIS — Q2112 Patent foramen ovale: Secondary | ICD-10-CM

## 2014-03-10 DIAGNOSIS — M79604 Pain in right leg: Secondary | ICD-10-CM

## 2014-03-10 DIAGNOSIS — I779 Disorder of arteries and arterioles, unspecified: Secondary | ICD-10-CM

## 2014-03-10 DIAGNOSIS — M79605 Pain in left leg: Secondary | ICD-10-CM

## 2014-03-10 NOTE — Assessment & Plan Note (Addendum)
Paroxysmal, continue current medical regimen. Recent echocardiogram report reviewed showing normal LVEF, mild left atrial enlargement, no major valvular abnormalities.

## 2014-03-10 NOTE — Patient Instructions (Addendum)

## 2014-03-10 NOTE — Assessment & Plan Note (Signed)
Mild, less than 50% LICA stenosis as of carotid Dopplers in July.

## 2014-03-10 NOTE — Progress Notes (Signed)
Clinical Summary [name] is an 80 y.o.female last seen in April. Overall she has been doing well. She had an episode of brief transient visual change back during the summer resulting in followup testing per Dr. Leandrew [name], outlined below. She has had no further events. Does not endorse any chest pain symptoms. Still has occasional palpitations but has not had to use short-acting Lopressor.  [facility] July reported LVEF 55-60%, mild left atrial enlargement, sclerotic aortic valve with mild aortic regurgitation, trace mitral regurgitation, RVSP 28 mm mercury. Carotid Dopplers also done at that time reported mild atherosclerotic plaque with less than 50% LICA stenosis.  Dobutamine echocardiogram in June 2013 demonstrated equivocal ST segment changes, bursts of AF and also NSVT, negative echocardiographic images for ischemia.    Allergies  Allergen Reactions  . Cephalexin     REACTION: nausea  . Digoxin     REACTION: rash on legs    Current Outpatient Prescriptions  Medication Sig Dispense Refill  . atenolol (TENORMIN) 50 MG tablet TAKE 1 TABLET BY MOUTH EVERY DAY  30 tablet  6  . CALCIUM-VITAMIN D PO Take 1 tablet by mouth daily.       Marland Kitchen. docusate sodium (COLACE) 100 MG capsule Take 100 mg by mouth at bedtime. Dosage not specified       . doxycycline (DORYX) 100 MG DR capsule Take 100 mg by mouth daily.      . furosemide (LASIX) 20 MG tablet Take 20 mg by mouth daily as needed.        . metoprolol tartrate (LOPRESSOR) 25 MG tablet Take 1/2 tab (12.5mg ) as needed for palpitations  15 tablet  3  . metroNIDAZOLE (METROCREAM) 0.75 % cream Apply 1 application topically daily.        . Multiple Vitamin (MULTIVITAMIN) tablet Take 1 tablet by mouth daily.        . Psyllium (METAMUCIL) 30.9 % POWD Take by mouth daily.        Marland Kitchen. warfarin (COUMADIN) 5 MG tablet Take 5 mg by mouth daily. Managed by Dr. Leandrew [name].  UAD.       No current facility-administered medications  for this visit.    Past Medical History  Diagnosis Date  . Atrial fibrillation     Paroxysmal; CHADS2 of two  . Type 2 diabetes mellitus   . History of cardiovascular stress test     Negative dobutamine echo 6/13  . Osteoarthritis   . Chronic edema   . Rosacea   . PFO (patent foramen ovale)     Social History [name] [name] reports that she has never smoked. She has never used smokeless tobacco. [name] reports that she does not drink alcohol.  Review of Systems No claudication. Stable appetite. Functional with her ADLs at home. No bleeding problems. Other systems reviewed and negative except as outlined.  Physical Examination Filed Vitals:   03/10/14 1134  BP: 157/77  Pulse: 51   Filed Weights   03/10/14 1134  Weight: 146 lb 12.8 oz (66.588 kg)    No acute distress.  HEENT: Conjunctiva and lids normal, oropharynx clear.  Neck: Supple, no elevated JVP or carotid bruits, no thyromegaly.  Lungs: Clear to auscultation, nonlabored breathing at rest.  Cardiac: Regular rate and rhythm, no S3 or significant systolic murmur, no pericardial rub.  Abdomen: Soft, nontender, bowel sounds present.  Extremities: No pitting edema, distal pulses 2+.  Skin: Warm and dry.  Musculoskeletal: No kyphosis.  Neuropsychiatric: Alert  and oriented x3, affect appropriate.   Problem List and Plan   Atrial fibrillation Paroxysmal, continue current medical regimen. Recent echocardiogram report reviewed showing normal LVEF, mild left atrial enlargement, no major valvular abnormalities.  Bilateral carotid artery disease Mild, less than 50% LICA stenosis as of carotid Dopplers in July.    Jonelle SidleSamuel G. Apryle [name], M.D., F.A.C.C.

## 2014-08-15 ENCOUNTER — Other Ambulatory Visit: Payer: Self-pay | Admitting: Cardiology

## 2014-09-16 ENCOUNTER — Ambulatory Visit (INDEPENDENT_AMBULATORY_CARE_PROVIDER_SITE_OTHER): Payer: Medicare Other | Admitting: Cardiology

## 2014-09-16 ENCOUNTER — Encounter: Payer: Self-pay | Admitting: Cardiology

## 2014-09-16 VITALS — BP 152/82 | HR 53 | Ht 62.0 in | Wt 149.0 lb

## 2014-09-16 DIAGNOSIS — I480 Paroxysmal atrial fibrillation: Secondary | ICD-10-CM

## 2014-09-16 DIAGNOSIS — I739 Peripheral vascular disease, unspecified: Secondary | ICD-10-CM

## 2014-09-16 DIAGNOSIS — I779 Disorder of arteries and arterioles, unspecified: Secondary | ICD-10-CM

## 2014-09-16 NOTE — Progress Notes (Signed)
Cardiology Office Note  Date: 09/16/2014   ID: [name] [name], DOB 03-25-1928, MRN [id number]  PCP: Juliette AlcideBURDINE,STEVEN E, MD  Primary Cardiologist: Nona DellSamuel McDowell, MD   Chief Complaint  Patient presents with  . Atrial Fibrillation    History of Present Illness: [name] [name] is an 81 y.o. female last seen in October 2015. She presents for a routine follow-up visit. She reports intermittent bouts of palpitations, this has happened perhaps 3 times since I last saw her, longest episode being about 8 hours. She has had no changes in her cardiac medications, reports no bleeding episodes on Coumadin.  Follow-up ECG is reviewed below, she is in sinus rhythm today.   Otherwise, recently having some pain with bursitis, difficulty walking even with her cane. She is following with Dr. Leandrew KoyanagiBurdine. I reviewed her most recent lab work.   Past Medical History  Diagnosis Date  . Paroxysmal atrial fibrillation   . Type 2 diabetes mellitus   . History of cardiovascular stress test     Negative dobutamine echo 6/13  . Osteoarthritis   . Chronic edema   . Rosacea   . PFO (patent foramen ovale)      Current Outpatient Prescriptions  Medication Sig Dispense Refill  . atenolol (TENORMIN) 50 MG tablet TAKE 1 TABLET BY MOUTH EVERY DAY 30 tablet 6  . CALCIUM-VITAMIN D PO Take 1 tablet by mouth daily.     Marland Kitchen. docusate sodium (COLACE) 100 MG capsule Take 100 mg by mouth at bedtime. Dosage not specified     . doxycycline (DORYX) 100 MG DR capsule Take 100 mg by mouth daily.    . furosemide (LASIX) 20 MG tablet Take 20 mg by mouth daily as needed.      . metroNIDAZOLE (METROCREAM) 0.75 % cream Apply 1 application topically daily.      . Multiple Vitamin (MULTIVITAMIN) tablet Take 1 tablet by mouth daily.      . Psyllium (METAMUCIL) 30.9 % POWD Take by mouth daily.      Marland Kitchen. warfarin (COUMADIN) 5 MG tablet Take 5 mg by mouth daily. Managed by Dr. Leandrew KoyanagiBurdine.  UAD.     No current facility-administered  medications for this visit.    Allergies:  Penicillins; Cephalexin; and Digoxin   Social History: The patient  reports that she has never smoked. She has never used smokeless tobacco. She reports that she does not drink alcohol.   ROS:  Please see the history of present illness. Otherwise, complete review of systems is positive for none.  All other systems are reviewed and negative.   Physical Exam: VS:  BP 152/82 mmHg  Pulse 53  Ht 5\' 2"  (1.575 m)  Wt 149 lb (67.586 kg)  BMI 27.25 kg/m2  SpO2 98%, BMI Body mass index is 27.25 kg/(m^2).  Wt Readings from Last 3 Encounters:  09/16/14 149 lb (67.586 kg)  03/10/14 146 lb 12.8 oz (66.588 kg)  09/18/13 150 lb (68.04 kg)     No acute distress.  HEENT: Conjunctiva and lids normal, oropharynx clear.  Neck: Supple, no elevated JVP or carotid bruits, no thyromegaly.  Lungs: Clear to auscultation, nonlabored breathing at rest.  Cardiac: Regular rate and rhythm, no S3 or significant systolic murmur, no pericardial rub.  Abdomen: Soft, nontender, bowel sounds present.  Extremities: No pitting edema, distal pulses 2+.  Skin: Warm and dry.  Musculoskeletal: No kyphosis.  Neuropsychiatric: Alert and oriented x3, affect appropriate.   ECG: ECG is ordered today and reviewed showing  sinus bradycardia with poor R-wave progression anteriorly.   Recent Labwork:  09/01/2014: BUN 19, creatinine 0.8, potassium 3.9, AST 22, ALT 15, cholesterol 251, triglycerides 208, HDL 73, LDL136, Hgb A1C 6.3  Other Studies Reviewed Today:  1. Echocardiogram done at [facility] back in July 2015 reported LVEF 55-60%, mild left atrial enlargement, sclerotic aortic valve with mild aortic regurgitation, trace mitral regurgitation, RVSP 28 mm mercury.   2. Carotid Dopplers done in July 2015 reported mild atherosclerotic plaque with less than 50% LICA stenosis.  Assessment and Plan:  1. Paroxysmal atrial fibrillation. Continue current medical regimen  including atenolol and Coumadin. If frequency of palpitations increases, we could consider antiarrhythmic therapy, although at this point she was comfortable with the current plan.  2. Carotid artery disease, less than 50% LICA stenosis by most recent Dopplers.  Current medicines were reviewed with the patient today.   Orders Placed This Encounter  Procedures  . EKG 12-Lead    Disposition: FU with me in 6 months.   Signed, Jonelle Sidle, MD, [facility] 09/16/2014 10:22 AM    [facility] at [facility] [address] [place], [place], [place] [address] Phone: [phone]; Fax: [phone]

## 2014-09-16 NOTE — Patient Instructions (Signed)
Your physician recommends that you continue on your current medications as directed. Please refer to the Current Medication list given to you today. Your physician recommends that you schedule a follow-up appointment in: 6 months. You will receive a reminder letter in the mail in about 4 months reminding you to call and schedule your appointment. If you don't receive this letter, please contact our office. 

## 2014-12-01 ENCOUNTER — Ambulatory Visit: Payer: Medicare Other | Admitting: Cardiology

## 2015-03-01 ENCOUNTER — Other Ambulatory Visit: Payer: Self-pay | Admitting: Cardiology

## 2015-03-15 ENCOUNTER — Encounter: Payer: Self-pay | Admitting: Cardiology

## 2015-03-15 ENCOUNTER — Ambulatory Visit (INDEPENDENT_AMBULATORY_CARE_PROVIDER_SITE_OTHER): Payer: Medicare Other | Admitting: Cardiology

## 2015-03-15 VITALS — BP 122/78 | HR 57 | Ht 62.0 in | Wt 148.0 lb

## 2015-03-15 DIAGNOSIS — I480 Paroxysmal atrial fibrillation: Secondary | ICD-10-CM

## 2015-03-15 DIAGNOSIS — E119 Type 2 diabetes mellitus without complications: Secondary | ICD-10-CM

## 2015-03-15 NOTE — Patient Instructions (Signed)
Your physician recommends that you continue on your current medications as directed. Please refer to the Current Medication list given to you today. Your physician recommends that you schedule a follow-up appointment in: 6 months. You will receive a reminder letter in the mail in about 4 months reminding you to call and schedule your appointment. If you don't receive this letter, please contact our office. 

## 2015-03-15 NOTE — Progress Notes (Signed)
Cardiology Office Note  Date: 03/15/2015   ID: [name] FlowBetty F [name], DOB February 17, 1928, MRN [id number]  PCP: Juliette AlcideBURDINE,STEVEN E, MD  Primary Cardiologist: Nona DellSamuel McDowell, MD   Chief Complaint  Patient presents with  . PAF    History of Present Illness: [name] [name] is a 81 y.o. female last seen in April 2016. She presents for a routine follow-up visit. Reports intermittent palpitations as before, has not had any significantly prolonged episodes however. We discussed her medications outlined below, and she states that she is satisfied with her symptom control at this time.  Main complaint is of recent trouble with lower back pain and also left knee pain. She has difficulty walking, uses a cane. She has not had any recent falls. She tells me that she had to have fluid removed from her left knee per Dr. Leandrew KoyanagiBurdine.   Past Medical History  Diagnosis Date  . Paroxysmal atrial fibrillation (HCC)   . Type 2 diabetes mellitus (HCC)   . History of cardiovascular stress test     Negative dobutamine echo 6/13  . Osteoarthritis   . Chronic edema   . Rosacea   . PFO (patent foramen ovale)     Current Outpatient Prescriptions  Medication Sig Dispense Refill  . atenolol (TENORMIN) 50 MG tablet TAKE 1 TABLET BY MOUTH EVERY DAY 30 tablet 6  . CALCIUM-VITAMIN D PO Take 1 tablet by mouth daily.     Marland Kitchen. docusate sodium (COLACE) 100 MG capsule Take 100 mg by mouth at bedtime. Dosage not specified     . doxycycline (DORYX) 100 MG DR capsule Take 100 mg by mouth daily.    . furosemide (LASIX) 20 MG tablet Take 20 mg by mouth daily as needed.      . metroNIDAZOLE (METROCREAM) 0.75 % cream Apply 1 application topically daily.      . Multiple Vitamin (MULTIVITAMIN) tablet Take 1 tablet by mouth daily.      . Psyllium (METAMUCIL) 30.9 % POWD Take by mouth daily.      Marland Kitchen. warfarin (COUMADIN) 5 MG tablet Take 5 mg by mouth daily. Managed by Dr. Leandrew KoyanagiBurdine.  UAD.     No current facility-administered medications  for this visit.    Allergies:  Penicillins; Cephalexin; and Digoxin   Social History: The patient  reports that she has never smoked. She has never used smokeless tobacco. She reports that she does not drink alcohol.   ROS:  Please see the history of present illness. Otherwise, complete review of systems is positive for none.  All other systems are reviewed and negative.   Physical Exam: VS:  BP 122/78 mmHg  Pulse 57  Ht 5\' 2"  (1.575 m)  Wt 148 lb (67.132 kg)  BMI 27.06 kg/m2  SpO2 93%, BMI Body mass index is 27.06 kg/(m^2).  Wt Readings from Last 3 Encounters:  03/15/15 148 lb (67.132 kg)  09/16/14 149 lb (67.586 kg)  03/10/14 146 lb 12.8 oz (66.588 kg)     No acute distress.  HEENT: Conjunctiva and lids normal, oropharynx clear.  Neck: Supple, no elevated JVP or carotid bruits, no thyromegaly.  Lungs: Clear to auscultation, nonlabored breathing at rest.  Cardiac: Regular rate and rhythm, no S3 or significant systolic murmur, no pericardial rub.  Abdomen: Soft, nontender, bowel sounds present.  Extremities: No pitting edema, distal pulses 2+.    ECG: ECG is not ordered today.   Recent Labwork:  April 2016: BUN 19, creatinine 0.8, potassium 3.9, AST 22, ALT  15, cholesterol 251, triglycerides 208, HDL 73, LDL 136, hemoglobin A1c 6.3  Other Studies Reviewed Today:  1. Echocardiogram done at [facility] back in July 2015 reported LVEF 55-60%, mild left atrial enlargement, sclerotic aortic valve with mild aortic regurgitation, trace mitral regurgitation, RVSP 28 mm mercury.   2. Carotid Dopplers done in July 2015 reported mild atherosclerotic plaque with less than 50% LICA stenosis.  Assessment and Plan:  1. Paroxysmal atrial fibrillation. She reports adequate symptom control on atenolol. Also continues on Coumadin with no bleeding problems. INR followed by Dr. Leandrew Koyanagi. No changes were made today.  2. Type 2 diabetes mellitus, followed by Dr. Leandrew Koyanagi.  Current  medicines were reviewed with the patient today.   Disposition: FU with me in 6 months.   Signed, Jonelle Sidle, MD, [facility] 03/15/2015 11:36 AM    [facility] at [facility] [address] [place], [place], [place] [address] Phone: [phone]; Fax: [phone]

## 2015-09-21 ENCOUNTER — Encounter: Payer: Self-pay | Admitting: Cardiology

## 2015-09-21 ENCOUNTER — Ambulatory Visit (INDEPENDENT_AMBULATORY_CARE_PROVIDER_SITE_OTHER): Payer: Medicare Other | Admitting: Cardiology

## 2015-09-21 VITALS — BP 140/82 | HR 60 | Ht 62.0 in | Wt 149.0 lb

## 2015-09-21 DIAGNOSIS — I480 Paroxysmal atrial fibrillation: Secondary | ICD-10-CM | POA: Diagnosis not present

## 2015-09-21 DIAGNOSIS — Z01810 Encounter for preprocedural cardiovascular examination: Secondary | ICD-10-CM

## 2015-09-21 MED ORDER — ATENOLOL 50 MG PO TABS: 50.0000 mg | ORAL_TABLET | Freq: Every day | ORAL | Status: DC

## 2015-09-21 NOTE — Patient Instructions (Signed)
Continue all current medications. Your physician wants you to follow up in: 6 months.  You will receive a reminder letter in the mail one-two months in advance.  If you don't receive a letter, please call our office to schedule the follow up appointment   

## 2015-09-21 NOTE — Progress Notes (Signed)
Cardiology Office Note  Date: 09/21/2015   ID: [name] [name], DOB 04/07/1928, MRN [id number]  PCP: Juliette Alcide, MD  Primary Cardiologist: Nona Dell, MD   Chief Complaint  Patient presents with  . Atrial Fibrillation    History of Present Illness: [name] [name] is an 82 y.o. female last seen in October 2016. She presents for a routine follow-up visit. She reports a few episodes of palpitations since I last saw her, presumably PAF. Episodes tend to last for a few hours at least. Overall she is satisfied with her current control on atenolol. We have discussed the possibility of adding an antiarrhythmic if her symptoms worsen.  She continues on Coumadin, followed by Dr. Leandrew Koyanagi. No reported spontaneous bleeding problems.  I reviewed her ECG today which shows sinus bradycardia with low voltage and poor R-wave progression. She does not report any angina symptoms.  She also reports trouble with chronic back and leg pain, to be reevaluated by Dr. Channing Mutters soon. She thinks that she may need to have surgery eventually.  Past Medical History  Diagnosis Date  . Paroxysmal atrial fibrillation (HCC)   . Type 2 diabetes mellitus (HCC)   . History of cardiovascular stress test     Negative dobutamine echo 6/13  . Osteoarthritis   . Chronic edema   . Rosacea   . PFO (patent foramen ovale)     Current Outpatient Prescriptions  Medication Sig Dispense Refill  . atenolol (TENORMIN) 50 MG tablet Take 1 tablet (50 mg total) by mouth daily. 30 tablet 6  . CALCIUM-VITAMIN D PO Take 1 tablet by mouth daily.     [name] [name] docusate sodium (COLACE) 100 MG capsule Take 100 mg by mouth at bedtime. Dosage not specified     . doxycycline (DORYX) 100 MG DR capsule Take 100 mg by mouth daily.    . furosemide (LASIX) 20 MG tablet Take 20 mg by mouth daily as needed.      . metroNIDAZOLE (METROCREAM) 0.75 % cream Apply 1 application topically daily.      . Multiple Vitamin (MULTIVITAMIN) tablet Take 1  tablet by mouth daily.      . Psyllium (METAMUCIL) 30.9 % POWD Take by mouth daily.      [name] [name] warfarin (COUMADIN) 5 MG tablet Take 5 mg by mouth daily. Managed by Dr. Leandrew Koyanagi.  UAD.     No current facility-administered medications for this visit.   Allergies:  Penicillins; Cephalexin; and Digoxin   Social History: The patient  reports that she has never smoked. She has never used smokeless tobacco. She reports that she does not drink alcohol.   ROS:  Please see the history of present illness. Otherwise, complete review of systems is positive for chronic back and leg pain.  All other systems are reviewed and negative.   Physical Exam: VS:  BP 140/82 mmHg  Pulse 60  Ht  (1.575 m)  Wt 149 lb (67.586 kg)  BMI 27.25 kg/m2  SpO2 98%, BMI Body mass index is 27.25 kg/(m^2).  Wt Readings from Last 3 Encounters:  09/21/15 149 lb (67.586 kg)  03/15/15 148 lb (67.132 kg)  09/16/14 149 lb (67.586 kg)    No acute distress.  HEENT: Conjunctiva and lids normal, oropharynx clear.  Neck: Supple, no elevated JVP or carotid bruits, no thyromegaly.  Lungs: Clear to auscultation, nonlabored breathing at rest.  Cardiac: Regular rate and rhythm, no S3 or significant systolic murmur, no pericardial rub.  Abdomen: Soft, nontender, bowel  sounds present.  Extremities: No pitting edema, distal pulses 2+.  ECG: I personally reviewed the prior tracing from 09/16/2014 which showed sinus bradycardia with decreased R progression.  Recent Labwork:  April 2016: [name] 19, creatinine 0.86, potassium 3.9, AST 22, ALT 15, cholesterol 251, triglycerides 208, HDL 73, LDL 136, hemoglobin A1c 6.3  Other Studies Reviewed Today:  Echocardiogram [name] [name]([facility]) July 2015: LVEF 55-60%, mild left atrial enlargement, sclerotic aortic valve with mild aortic regurgitation, trace mitral regurgitation, RVSP 28 mm mercury.   Carotid Dopplers July 2015: Mild atherosclerotic plaque with less than 50% LICA  stenosis.  Assessment and Plan:  1. Paroxysmal atrial fibrillation. Continue atenolol and Coumadin. If symptoms progress/worsen, we could always consider trying flecainide.  2. Preoperative evaluation. She may be in need of back surgery per Dr. Channing Muttersoy, evaluation pending. I would anticipate that she should be able to proceed at an acceptable perioperative cardiac risk. Obviously, she would need to come off Coumadin temporarily.  Current medicines were reviewed with the patient today.   Orders Placed This Encounter  Procedures  . EKG 12-Lead    Disposition: FU with me in 6 months.   Signed, Jonelle SidleSamuel G. Rashada Klontz, MD, [facility] 09/21/2015 11:19 AM    [facility] [address], [place], [place] [address] Phone: [phone]; Fax: [phone]

## 2016-02-03 ENCOUNTER — Telehealth: Payer: Self-pay | Admitting: *Deleted

## 2016-02-03 MED ORDER — METOPROLOL SUCCINATE ER 50 MG PO TB24: 50.0000 mg | ORAL_TABLET | Freq: Every day | ORAL | 3 refills | Status: DC

## 2016-02-03 NOTE — Telephone Encounter (Signed)
If she is taking Atenolol 50 mg daily, could switch to Toprol-XL 50 mg daily.

## 2016-02-03 NOTE — Telephone Encounter (Signed)
Pt made aware, Medication sent to pharmacy.  

## 2016-02-03 NOTE — Telephone Encounter (Signed)
Pt currently on Atenolol 50 mg daily - Atenolol on long term back order [place], will forward to provider for replacement.

## 2016-03-28 NOTE — Progress Notes (Signed)
Cardiology Office Note  Date: 03/29/2016   ID: [name] FlowBetty F [name], DOB [birth year], MRN [name]  PCP: [name] [name]  Primary Cardiologist: Nona DellSamuel Chevez Sambrano, [name]   Chief Complaint  Patient presents with  . Atrial Fibrillation    History of Present Illness: [name] [name] is an 82 y.o. female last seen in April. She presents for a routine follow-up visit. She tells me that she has been experiencing intermittent episodes of chest tightness suggestive of angina, also increasing palpitations. She reports having a [facility] test done at [facility], we have requested results. She was told already that the results were reassuring however.  She remains on Coumadin with follow-up per Dr. Leandrew KoyanagiBurdine. No reported bleeding episodes.  Heart rate is regular today. I reviewed her medications. She reports compliance with Toprol-XL and her resting heart rate is around 60. We did talk about other medication options including nitrates for possible microvascular angina and even antiarrhythmics if she is having increase atrial fibrillation burden. She is very hesitant at any further medications however feels like she is "getting along okay."  Past Medical History:  Diagnosis Date  . Chronic edema   . History of cardiovascular stress test    Negative dobutamine echo 6/13  . Osteoarthritis   . Paroxysmal atrial fibrillation (HCC)   . PFO (patent foramen ovale)   . Rosacea   . Type 2 diabetes mellitus (HCC)     Past Surgical History:  Procedure Laterality Date  . ANKLE SURGERY Left   . HIP SURGERY Right   . PARTIAL HYSTERECTOMY     fibroid tumors   . RECTOCELE REPAIR    . REPLACEMENT TOTAL KNEE BILATERAL      Current Outpatient Prescriptions  Medication Sig Dispense Refill  . CALCIUM-VITAMIN D PO Take 1 tablet by mouth daily.     Marland Kitchen. docusate sodium (COLACE) 100 MG capsule Take 100 mg by mouth at bedtime. Dosage not specified     . doxycycline (DORYX) 100 MG DR capsule Take 100 mg by mouth  daily.    . furosemide (LASIX) 20 MG tablet Take 20 mg by mouth daily as needed.      Marland Kitchen. HYDROcodone-acetaminophen (NORCO/VICODIN) 5-325 MG tablet Take 1 tablet by mouth daily as needed.    . metoprolol succinate (TOPROL XL) 50 MG 24 hr tablet Take 1 tablet (50 mg total) by mouth daily. 90 tablet 3  . metroNIDAZOLE (METROCREAM) 0.75 % cream Apply 1 application topically daily.      . Multiple Vitamin (MULTIVITAMIN) tablet Take 1 tablet by mouth daily.      . Psyllium (METAMUCIL) 30.9 % POWD Take by mouth daily.      Marland Kitchen. warfarin (COUMADIN) 5 MG tablet Take 5 mg by mouth daily. Managed by Dr. Leandrew KoyanagiBurdine.  UAD.     No current facility-administered medications for this visit.    Allergies:  Penicillins; Cephalexin; and Digoxin   Social History: The patient  reports that she has never smoked. She has never used smokeless tobacco. She reports that she does not drink alcohol.   ROS:  Please see the history of present illness. Otherwise, complete review of systems is positive for unsteadiness, uses a cane.  All other systems are reviewed and negative.   Physical Exam: VS:  BP (!) 146/66   Pulse (!) 59   Ht 5\' 2"  (1.575 m)   Wt 148 lb (67.1 kg)   BMI 27.07 kg/m , BMI Body mass index is 27.07 kg/m.  Wt Readings from  Last 3 Encounters:  03/29/16 148 lb (67.1 kg)  09/21/15 149 lb (67.6 kg)  03/15/15 148 lb (67.1 kg)    Elderly woman, no acute distress.  HEENT: Conjunctiva and lids normal, oropharynx clear.  Neck: Supple, no elevated JVP or carotid bruits, no thyromegaly.  Lungs: Clear to auscultation, nonlabored breathing at rest.  Cardiac: Regular rate and rhythm, no S3 or significant systolic murmur, no pericardial rub.  Abdomen: Soft, nontender, bowel sounds present.  Extremities: No pitting edema, distal pulses 2+. Skin: Warm and dry. Musculoskeletal: Kyphosis noted. Neuropsychiatric: Alert and oriented 3, affect appropriate.  ECG: I personally reviewed the tracing from 09/16/2014  which showed sinus bradycardia with decreased R progression.  Recent Labwork:  April 2016: BUN 19, creatinine 0.86, potassium 3.9, AST 22, ALT 15, cholesterol 251, triglycerides 208, HDL 73, LDL 136, hemoglobin A1c 6.3  Other Studies Reviewed Today:  Echocardiogram [name]([facility]) July 2015: LVEF 55-60%, mild left atrial enlargement, sclerotic aortic valve with mild aortic regurgitation, trace mitral regurgitation, RVSP 28 mm mercury.   Carotid Dopplers July 2015: Mild atherosclerotic plaque with less than 50% LICA stenosis.  Assessment and Plan:  1. Intermittent chest tightness suggestive of angina. Patient [facility]. We are requesting the results to review. We did talk about adding a nitrate to see if this would be helpful, she did not want to try any new medications at this time. Would not further up titrate beta blocker with low resting heart rate.  2. Paroxysmal atrial fibrillation. Reports increasing sense of palpitations and she could be having increased arrhythmia burden. Continue beta blocker and Coumadin. We did talk about antiarrhythmics, but again she was hesitant to add any new medications.  3. History of leg edema, controlled on Lasix.  4. History of PFO without right heart strain.  Current medicines were reviewed with the patient today.  Disposition: Follow-up in 6 months.  Signed, Jonelle SidleSamuel G. Bence Trapp, [name], [facility] 03/29/2016 9:11 AM    [facility] [address], [place], [place] [address] Phone: [phone]; Fax: [phone]

## 2016-03-29 ENCOUNTER — Encounter: Payer: Self-pay | Admitting: Cardiology

## 2016-03-29 ENCOUNTER — Encounter: Payer: Self-pay | Admitting: *Deleted

## 2016-03-29 ENCOUNTER — Ambulatory Visit (INDEPENDENT_AMBULATORY_CARE_PROVIDER_SITE_OTHER): Payer: Medicare Other | Admitting: Cardiology

## 2016-03-29 VITALS — BP 146/66 | HR 59 | Ht 62.0 in | Wt 148.0 lb

## 2016-03-29 DIAGNOSIS — I209 Angina pectoris, unspecified: Secondary | ICD-10-CM

## 2016-03-29 DIAGNOSIS — I480 Paroxysmal atrial fibrillation: Secondary | ICD-10-CM

## 2016-03-29 DIAGNOSIS — I208 Other forms of angina pectoris: Secondary | ICD-10-CM

## 2016-03-29 DIAGNOSIS — R600 Localized edema: Secondary | ICD-10-CM

## 2016-03-29 DIAGNOSIS — Q2112 Patent foramen ovale: Secondary | ICD-10-CM

## 2016-03-29 DIAGNOSIS — Q211 Atrial septal defect: Secondary | ICD-10-CM

## 2016-03-29 NOTE — Patient Instructions (Signed)
Medication Instructions:  none  Labwork: none  Testing/Procedures: none  Follow-Up: Your physician wants you to follow up in: 6 months.  You will receive a reminder letter in the mail one-two months in advance.  If you don't receive a letter, please call our office to schedule the follow up appointment   Any Other Special Instructions Will Be Listed Below (If Applicable).  If you need a refill on your cardiac medications before your next appointment, please call your pharmacy.  

## 2016-03-31 ENCOUNTER — Telehealth: Payer: Self-pay | Admitting: *Deleted

## 2016-03-31 NOTE — Telephone Encounter (Signed)
Patient informed. 

## 2016-03-31 NOTE — Telephone Encounter (Signed)
-----   Message from Jonelle SidleSamuel G McDowell, MD sent at 03/30/2016  3:41 PM EDT ----- Results reviewed. [facility]. Continue with same follow-up plan for now. A copy of this test should be forwarded to Juliette AlcideBURDINE,STEVEN E, MD.

## 2016-09-21 NOTE — Progress Notes (Signed)
Cardiology Office Note  Date: 09/25/2016   ID: THEODORE RAHRIG, DOB March 09, 1928, MRN [id number]  PCP: Juliette Alcide, MD  Primary Cardiologist: Nona Dell, MD   Chief Complaint  Patient presents with  . PAF    History of Present Illness: [name] [name] is an 83 y.o. female last seen in November 2017. She presents for a routine follow-up visit. Reports intermittent palpitations, usually in the evenings consistent with PAF. States that pattern and intensity have not changed.  She continues on Coumadin with follow-up per Dr. Leandrew Koyanagi. Reports no spontaneous bleeding problems.  Myoview study from [place] done late last year is outlined below, low risk.   I personally reviewed her ECG today which shows sinus rhythm with PAC, left anterior fascicular block, poor R-wave progression.   Past Medical History:  Diagnosis Date  . Chronic edema   . History of cardiovascular stress test    Negative dobutamine echo 6/13  . Osteoarthritis   . Paroxysmal atrial fibrillation (HCC)   . PFO (patent foramen ovale)   . Rosacea   . Type 2 diabetes mellitus (HCC)     Past Surgical History:  Procedure Laterality Date  . ANKLE SURGERY Left   . HIP SURGERY Right   . PARTIAL HYSTERECTOMY     fibroid tumors   . RECTOCELE REPAIR    . REPLACEMENT TOTAL KNEE BILATERAL      Current Outpatient Prescriptions  Medication Sig Dispense Refill  . CALCIUM-VITAMIN D PO Take 1 tablet by mouth daily.     Marland Kitchen docusate sodium (COLACE) 100 MG capsule Take 100 mg by mouth at bedtime. Dosage not specified     . doxycycline (DORYX) 100 MG DR capsule Take 100 mg by mouth daily.    . furosemide (LASIX) 20 MG tablet Take 20 mg by mouth daily as needed.      Marland Kitchen HYDROcodone-acetaminophen (NORCO/VICODIN) 5-325 MG tablet Take 1 tablet by mouth daily as needed.    . metoprolol succinate (TOPROL XL) 50 MG 24 hr tablet Take 1 tablet (50 mg total) by mouth daily. 90 tablet 3  . metroNIDAZOLE (METROCREAM) 0.75 %  cream Apply 1 application topically daily.      . Multiple Vitamin (MULTIVITAMIN) tablet Take 1 tablet by mouth daily.      . potassium chloride SA (K-DUR,KLOR-CON) 20 MEQ tablet Take 20 mEq by mouth daily.    . Psyllium (METAMUCIL) 30.9 % POWD Take by mouth daily.      Marland Kitchen warfarin (COUMADIN) 5 MG tablet Take 5 mg by mouth daily. Managed by Dr. Leandrew Koyanagi.  UAD.     No current facility-administered medications for this visit.    Allergies:  Penicillins; Cephalexin; and Digoxin   Social History: The patient  reports that she has never smoked. She has never used smokeless tobacco. She reports that she does not drink alcohol.   Family History: The patient's family history includes Heart attack in her maternal grandmother and mother; Other in her father.   ROS:  Please see the history of present illness. Otherwise, complete review of systems is positive for none.  All other systems are reviewed and negative.   Physical Exam: VS:  BP 107/66   Pulse 62   Ht  (1.575 m)   Wt 149 lb 12.8 oz (67.9 kg)   SpO2 98%   BMI 27.40 kg/m , BMI Body mass index is 27.4 kg/m.  Wt Readings from Last 3 Encounters:  09/25/16 149 lb 12.8 oz (67.9 kg)  03/29/16 148 lb (67.1 kg)  09/21/15 149 lb (67.6 kg)    Elderly woman, no acute distress.  HEENT: Conjunctiva and lids normal, oropharynx clear.  Neck: Supple, no elevated JVP or carotid bruits, no thyromegaly.  Lungs: Clear to auscultation, nonlabored breathing at rest.  Cardiac: Regular rate and rhythm, no S3 or significant systolic murmur, no pericardial rub.  Abdomen: Soft, nontender, bowel sounds present.  Extremities: No pitting edema, distal pulses 2+. Skin: Warm and dry. Musculoskeletal: Kyphosis noted. Neuropsychiatric: Alert and oriented 3, affect appropriate.  ECG: I personally reviewed the tracing from 09/21/2015 which showed sinus bradycardia with low voltage, leftward axis, R' in lead V1 and V2, poor R wave progression.  Other  Studies Reviewed Today:  Lexiscan Myoview 03/16/2016 [facility]): No myocardial perfusion defects to indicate scar or ischemia, LVEF 69%.  Assessment and Plan:  1. Paroxysmal atrial fibrillation, CHADSVASC score is 3. She continues on Toprol-XL and Coumadin. No overall change in symptom frequency. She is in sinus rhythm today.  2. History of possible angina symptoms with reassuring ischemic workup, low risk Myoview in October 2017.  3. Asymptomatic PFO.  4. Mild, chronic leg edema, controlled with Lasix.  Current medicines were reviewed with the patient today.   Orders Placed This Encounter  Procedures  . EKG 12-Lead    Disposition: Follow-up in 6 months.  Signed, Jonelle Sidle, MD, [facility] 09/25/2016 9:31 AM    [facility] at [facility] [address] [place], [place], [place] [address] Phone: [phone]; Fax: [phone]

## 2016-09-25 ENCOUNTER — Ambulatory Visit (INDEPENDENT_AMBULATORY_CARE_PROVIDER_SITE_OTHER): Payer: Medicare Other | Admitting: Cardiology

## 2016-09-25 ENCOUNTER — Encounter: Payer: Self-pay | Admitting: Cardiology

## 2016-09-25 VITALS — BP 107/66 | HR 62 | Ht 62.0 in | Wt 149.8 lb

## 2016-09-25 DIAGNOSIS — I209 Angina pectoris, unspecified: Secondary | ICD-10-CM | POA: Diagnosis not present

## 2016-09-25 DIAGNOSIS — R600 Localized edema: Secondary | ICD-10-CM

## 2016-09-25 DIAGNOSIS — I480 Paroxysmal atrial fibrillation: Secondary | ICD-10-CM

## 2016-09-25 DIAGNOSIS — I208 Other forms of angina pectoris: Secondary | ICD-10-CM

## 2016-09-25 DIAGNOSIS — Q211 Atrial septal defect: Secondary | ICD-10-CM

## 2016-09-25 DIAGNOSIS — Q2112 Patent foramen ovale: Secondary | ICD-10-CM

## 2016-09-25 NOTE — Patient Instructions (Signed)

## 2016-12-11 ENCOUNTER — Ambulatory Visit (INDEPENDENT_AMBULATORY_CARE_PROVIDER_SITE_OTHER): Payer: Medicare Other

## 2016-12-11 ENCOUNTER — Encounter: Payer: Self-pay | Admitting: Cardiology

## 2016-12-11 ENCOUNTER — Ambulatory Visit (INDEPENDENT_AMBULATORY_CARE_PROVIDER_SITE_OTHER): Payer: Medicare Other | Admitting: Cardiology

## 2016-12-11 ENCOUNTER — Telehealth: Payer: Self-pay | Admitting: *Deleted

## 2016-12-11 VITALS — BP 142/78 | HR 66 | Ht 62.0 in | Wt 150.0 lb

## 2016-12-11 DIAGNOSIS — I499 Cardiac arrhythmia, unspecified: Secondary | ICD-10-CM | POA: Diagnosis not present

## 2016-12-11 DIAGNOSIS — I480 Paroxysmal atrial fibrillation: Secondary | ICD-10-CM | POA: Diagnosis not present

## 2016-12-11 DIAGNOSIS — I208 Other forms of angina pectoris: Secondary | ICD-10-CM

## 2016-12-11 DIAGNOSIS — I482 Chronic atrial fibrillation, unspecified: Secondary | ICD-10-CM

## 2016-12-11 NOTE — Patient Instructions (Signed)
Your physician recommends that you schedule a follow-up appointment in:  2-3 weeks with Dr Diona BrownerMcDowell   Your physician recommends that you continue on your current medications as directed. Please refer to the Current Medication list given to you today.     Your physician has recommended that you wear an event monitor for 7 days. Event monitors are medical devices that record the heart's electrical activity. Doctors most often us these monitors to diagnose arrhythmias. Arrhythmias are problems with the speed or rhythm of the heartbeat. The monitor is a small, portable device. You can wear one while you do your normal daily activities. This is usually used to diagnose what is causing palpitations/syncope (passing out).     No lab work ordered today.     Thank you for choosing [facility] !

## 2016-12-11 NOTE — Progress Notes (Signed)
Clinical Summary [name] is a 83 y.o.female regular patient of Dr Diona BrownerMcDowell, she is added on to my schedule today for palpitations   1. PAF - has been on Toprol XL for rate control - she is on coumadin for anticoagulation - no caffeine, no EtOH  - recent palpitations over the last 10 days. Short induration, but frequent and multiple throughout the day. Can have some assocaited SOB. Has had some generalized fatigue.  - compliant with meds    Past Medical History:  Diagnosis Date  . Chronic edema   . History of cardiovascular stress test    Negative dobutamine echo 6/13  . Osteoarthritis   . Paroxysmal atrial fibrillation (HCC)   . PFO (patent foramen ovale)   . Rosacea   . Type 2 diabetes mellitus (HCC)      Allergies  Allergen Reactions  . Penicillins Hives  . Cephalexin     REACTION: nausea  . Digoxin     REACTION: rash on legs     Current Outpatient Prescriptions  Medication Sig Dispense Refill  . CALCIUM-VITAMIN D PO Take 1 tablet by mouth daily.     Marland Kitchen. docusate sodium (COLACE) 100 MG capsule Take 100 mg by mouth at bedtime. Dosage not specified     . doxycycline (DORYX) 100 MG DR capsule Take 100 mg by mouth daily.    . furosemide (LASIX) 20 MG tablet Take 20 mg by mouth daily as needed.      Marland Kitchen. HYDROcodone-acetaminophen (NORCO/VICODIN) 5-325 MG tablet Take 1 tablet by mouth daily as needed.    . metoprolol succinate (TOPROL XL) 50 MG 24 hr tablet Take 1 tablet (50 mg total) by mouth daily. 90 tablet 3  . metroNIDAZOLE (METROCREAM) 0.75 % cream Apply 1 application topically daily.      . Multiple Vitamin (MULTIVITAMIN) tablet Take 1 tablet by mouth daily.      . potassium chloride SA (K-DUR,KLOR-CON) 20 MEQ tablet Take 20 mEq by mouth daily.    . Psyllium (METAMUCIL) 30.9 % POWD Take by mouth daily.      Marland Kitchen. warfarin (COUMADIN) 5 MG tablet Take 5 mg by mouth daily. Managed by Dr. Leandrew KoyanagiBurdine.  UAD.     No current facility-administered medications for this  visit.      Past Surgical History:  Procedure Laterality Date  . ANKLE SURGERY Left   . HIP SURGERY Right   . PARTIAL HYSTERECTOMY     fibroid tumors   . RECTOCELE REPAIR    . REPLACEMENT TOTAL KNEE BILATERAL       Allergies  Allergen Reactions  . Penicillins Hives  . Cephalexin     REACTION: nausea  . Digoxin     REACTION: rash on legs      Family History  Problem Relation Age of Onset  . Heart attack Mother   . Other Father        blood poisioning  . Heart attack Maternal Grandmother      Social History [name] [name] reports that she has never smoked. She has never used smokeless tobacco. [name] reports that she does not drink alcohol.   Review of Systems CONSTITUTIONAL: + fatigue HEENT: Eyes: No visual loss, blurred vision, double vision or yellow sclerae.No hearing loss, sneezing, congestion, runny nose or sore throat.  SKIN: No rash or itching.  CARDIOVASCULAR: per hPI RESPIRATORY: per hpi GASTROINTESTINAL: No anorexia, nausea, vomiting or diarrhea. No abdominal pain or blood.  GENITOURINARY: No burning on urination,  no polyuria NEUROLOGICAL: No headache, dizziness, syncope, paralysis, ataxia, numbness or tingling in the extremities. No change in bowel or bladder control.  MUSCULOSKELETAL: No muscle, back pain, joint pain or stiffness.  LYMPHATICS: No enlarged nodes. No history of splenectomy.  PSYCHIATRIC: No history of depression or anxiety.  ENDOCRINOLOGIC: No reports of sweating, cold or heat intolerance. No polyuria or polydipsia.  Marland Kitchen   Physical Examination Vitals:   12/11/16 1129  BP: (!) 142/78  Pulse: 66   Vitals:   12/11/16 1129  Weight: 150 lb (68 kg)  Height: 5\' 2"  (1.575 m)    Gen: resting comfortably, no acute distress HEENT: no scleral icterus, pupils equal round and reactive, no palptable cervical adenopathy,  CV: irreg, no m/r/g, no jvd Resp: Clear to auscultation bilaterally GI: abdomen is soft, non-tender,  non-distended, normal bowel sounds, no hepatosplenomegaly MSK: extremities are warm, no edema.  Skin: warm, no rash Neuro:  no focal deficits Psych: appropriate affect     Assessment and Plan  1. PAF - recent palpitations. EKG in clinic shows SR with PACs - low normal heart rate in clinic, she reports in the past she had fatigue on higher dose of beta blocker - we will obtain 7 day event monitor to further evaluate. Pending results may consider trial of increasing av nodal agent, or if episodes of afib could consider antiarrhythmic therapy. - request recent labs from pcp to f/u K, Mg, and TSH. If all not included in recent labs may need to order at next f/u.    F/u Dr Diona Browner 2-3 weeks.       Antoine Poche, M.D.

## 2016-12-11 NOTE — Telephone Encounter (Signed)
Patient c/o of her heart being out of rhythm for 10 days. Patient said it felt like her heart was jumping around, sob and fatigue. No c/o chest pain or dizziness. Patient is requesting an appointment to be seen. Offered appt at the A-fib clinic this week and patient stated that [place]. Patient was given an appt to be seen today at the [place] office with Dr. Wyline MoodBranch.

## 2016-12-17 DIAGNOSIS — I482 Chronic atrial fibrillation: Secondary | ICD-10-CM

## 2016-12-27 NOTE — Progress Notes (Signed)
Cardiology Office Note  Date: 12/28/2016   ID: [name] [name], DOB 1928-04-21, MRN [id number]  PCP: Juliette AlcideBurdine, Steven E, MD  Primary Cardiologist: Nona DellSamuel Edris Schneck, MD   Chief Complaint  Patient presents with  . PAF    History of Present Illness: [name] [name] is an 83 y.o. female seen in the office recently by Dr. Wyline MoodBranch as an add-on complaining of increased palpitations. ECG at that time showed sinus rhythm with PACs. She was scheduled for further outpatient cardiac monitoring. Fortunately, monitoring did not demonstrate any definitive recurrent atrial fibrillation.  She states that she feels better on follow-up today. She does describe episodes that sound like paroxysmal atrial fibrillation, these make her feel very weak and tired. Her heart rate is well controlled on current dose of Toprol-XL and she reports compliance with Coumadin. She has no definitive history of ischemic heart disease and a low risk Myoview study from last year. We discussed initiating a trial of flecainide to see if this helps suppress arrhythmia and reduce her symptoms.  Past Medical History:  Diagnosis Date  . Chronic edema   . History of cardiovascular stress test    Negative dobutamine echo 6/13  . Osteoarthritis   . Paroxysmal atrial fibrillation (HCC)   . PFO (patent foramen ovale)   . Rosacea   . Type 2 diabetes mellitus (HCC)     Past Surgical History:  Procedure Laterality Date  . ANKLE SURGERY Left   . HIP SURGERY Right   . PARTIAL HYSTERECTOMY     fibroid tumors   . RECTOCELE REPAIR    . REPLACEMENT TOTAL KNEE BILATERAL      Current Outpatient Prescriptions  Medication Sig Dispense Refill  . CALCIUM-VITAMIN D PO Take 1 tablet by mouth daily.     Marland Kitchen. docusate sodium (COLACE) 100 MG capsule Take 100 mg by mouth at bedtime. Dosage not specified     . doxycycline (DORYX) 100 MG DR capsule Take 100 mg by mouth daily.    . furosemide (LASIX) 20 MG tablet Take 20 mg by mouth daily as  needed.      Marland Kitchen. HYDROcodone-acetaminophen (NORCO/VICODIN) 5-325 MG tablet Take 1 tablet by mouth daily as needed.    . metoprolol succinate (TOPROL XL) 50 MG 24 hr tablet Take 1 tablet (50 mg total) by mouth daily. 90 tablet 3  . metroNIDAZOLE (METROCREAM) 0.75 % cream Apply 1 application topically daily.      . Multiple Vitamin (MULTIVITAMIN) tablet Take 1 tablet by mouth daily.      . potassium chloride SA (K-DUR,KLOR-CON) 20 MEQ tablet Take 20 mEq by mouth daily.    . Psyllium (METAMUCIL) 30.9 % POWD Take by mouth daily.      Marland Kitchen. warfarin (COUMADIN) 5 MG tablet Take 5 mg by mouth daily. Managed by Dr. Leandrew KoyanagiBurdine.  UAD.    . flecainide (TAMBOCOR) 50 MG tablet Take 1 tablet (50 mg total) by mouth 2 (two) times daily. 180 tablet 3   No current facility-administered medications for this visit.    Allergies:  Penicillins; Cephalexin; and Digoxin   Social History: The patient  reports that she has never smoked. She has never used smokeless tobacco. She reports that she does not drink alcohol or use drugs.   ROS:  Please see the history of present illness. Otherwise, complete review of systems is positive for none.  All other systems are reviewed and negative.   Physical Exam: VS:  BP (!) 144/84 (BP Location: Right Arm)  Pulse 64   Ht 5\' 2"  (1.575 m)   Wt 148 lb (67.1 kg)   SpO2 98%   BMI 27.07 kg/m , BMI Body mass index is 27.07 kg/m.  Wt Readings from Last 3 Encounters:  12/28/16 148 lb (67.1 kg)  12/11/16 150 lb (68 kg)  09/25/16 149 lb 12.8 oz (67.9 kg)    Elderly woman, noacute distress.  HEENT: Conjunctiva and lids normal, oropharynx clear.  Neck: Supple, no elevated JVP or carotid bruits, no thyromegaly.  Lungs: Clear to auscultation, nonlabored breathing at rest.  Cardiac: Regular rate and rhythm, no S3 or significant systolic murmur, no pericardial rub.  Abdomen: Soft, nontender, bowel sounds present.  Extremities: No pitting edema, distal pulses 2+. Skin: Warm and  dry. Musculoskeletal: Kyphosis noted. Neuropsychiatric: Alert and oriented 3, affect appropriate.  ECG: I personally reviewed the tracing from 12/11/2016 which showed sinus rhythm with PACs, left anterior fascicular block, poor R-wave progression, nonspecific T-wave changes.  Recent Labwork:  June 2018: Cholesterol 223, triglycerides 149, HDL 62, LDL 131, BUN 24, creatinine 0.89, potassium 4.2, AST 21, ALT 13, hemoglobin 13.9, platelets 199, TSH 3.48, hemoglobin A1c 6.1  Other Studies Reviewed Today:  Lexiscan Myoview 03/16/2016 [facility]): No myocardial perfusion defects to indicate scar or ischemia, LVEF 69%.  Assessment and Plan:  1. Paroxysmal atrial fibrillation with CHADSVASC score of 3. We will continue Toprol-XL and Coumadin. Initiate flecainide 50 mg twice daily. Follow-up ECG with nurse visit in 2 weeks and then clinical assessment in 3 months.  2. No definitive history of ischemic heart disease with low risk Myoview in October 2017.  3. Asymptomatic PFO.  4. Mild chronic leg edema. She continues on diuretic with control.  Current medicines were reviewed with the patient today.  Disposition: Follow-up in 3 months.  Signed, Jonelle SidleSamuel G. Camerin Ladouceur, MD, [facility] 12/28/2016 10:10 AM    [facility] [address], [place], [place] [address] Phone: [phone]([phone]; Fax: [phone]

## 2016-12-28 ENCOUNTER — Encounter: Payer: Self-pay | Admitting: Cardiology

## 2016-12-28 ENCOUNTER — Ambulatory Visit (INDEPENDENT_AMBULATORY_CARE_PROVIDER_SITE_OTHER): Payer: Medicare Other | Admitting: Cardiology

## 2016-12-28 VITALS — BP 144/84 | HR 64 | Ht 62.0 in | Wt 148.0 lb

## 2016-12-28 DIAGNOSIS — Q2112 Patent foramen ovale: Secondary | ICD-10-CM

## 2016-12-28 DIAGNOSIS — Q211 Atrial septal defect: Secondary | ICD-10-CM | POA: Diagnosis not present

## 2016-12-28 DIAGNOSIS — I480 Paroxysmal atrial fibrillation: Secondary | ICD-10-CM | POA: Diagnosis not present

## 2016-12-28 DIAGNOSIS — I208 Other forms of angina pectoris: Secondary | ICD-10-CM

## 2016-12-28 DIAGNOSIS — R600 Localized edema: Secondary | ICD-10-CM

## 2016-12-28 MED ORDER — FLECAINIDE ACETATE 50 MG PO TABS: 50.0000 mg | ORAL_TABLET | Freq: Two times a day (BID) | ORAL | 3 refills | Status: DC

## 2016-12-28 NOTE — Patient Instructions (Signed)
Medication Instructions:  START FLECAINIDE 50 MG TWICE DAILY   Labwork: NONE  Testing/Procedures: NONE  Follow-Up: Your physician recommends that you schedule a follow-up appointment in: 2 WEEKS FOR A NURSE VISIT WITH AN EKG Your physician recommends that you schedule a follow-up appointment in: 3 MONTHS WITH DR. MCDOWELL     Any Other Special Instructions Will Be Listed Below (If Applicable).     If you need a refill on your cardiac medications before your next appointment, please call your pharmacy.

## 2017-01-11 ENCOUNTER — Ambulatory Visit (INDEPENDENT_AMBULATORY_CARE_PROVIDER_SITE_OTHER): Payer: Medicare Other

## 2017-01-11 VITALS — BP 121/73 | HR 58

## 2017-01-11 DIAGNOSIS — I4891 Unspecified atrial fibrillation: Secondary | ICD-10-CM | POA: Diagnosis not present

## 2017-01-11 NOTE — Progress Notes (Signed)
ECG reviewed showing sinus bradycardia with left anterior fascicular block as before, poor R-wave progression, QRS duration 112 ms. Continue with current treatment plan.

## 2017-01-11 NOTE — Progress Notes (Addendum)
Patient in office today for nurse visit. Patient states she is feeling better since last visit with Dr. Diona BrownerMcDowell. Patient states she has not felt like she was in a-fib since last office visit. Patient has not missed any doses of medication. Patient tolerating flecainide well. EKG obtained in office today

## 2017-01-16 ENCOUNTER — Other Ambulatory Visit: Payer: Self-pay | Admitting: Cardiology

## 2017-03-29 NOTE — Progress Notes (Signed)
Cardiology Office Note  Date: 03/30/2017   ID: [name] FlowBetty F Brasil, DOB 01/22/1928, MRN [id number]  PCP: Juliette AlcideBurdine, Steven E, MD  Primary Cardiologist: Nona DellSamuel McDowell, MD   Chief Complaint  Patient presents with  . PAF    History of Present Illness: [name] [name] is an 83 y.o. female last seen in August.  She presents for a routine follow-up visit. At the last visit we initiated Flecainide for suppression of recurrent atrial fibrillation.  Rates that this did help, but she decided to stop the medication due to bradycardia which she had noticed on checking her pulse at home.  Heart rate is in the 50s today, most likely related to her Toprol-XL however.  She still reports brief episodes of palpitations.  Otherwise she continues on Coumadin, followed by Dr. Leandrew KoyanagiBurdine.  She continues to live in her own home, does ADLs but is limited by chronic back and leg pain.  She uses a cane.  She denies any falls.  She does not report any progressive leg edema, orthopnea, or PND.  Past Medical History:  Diagnosis Date  . Chronic edema   . History of cardiovascular stress test    Negative dobutamine echo 6/13  . Osteoarthritis   . Paroxysmal atrial fibrillation (HCC)   . PFO (patent foramen ovale)   . Rosacea   . Type 2 diabetes mellitus (HCC)     Past Surgical History:  Procedure Laterality Date  . ANKLE SURGERY Left   . HIP SURGERY Right   . PARTIAL HYSTERECTOMY     fibroid tumors   . RECTOCELE REPAIR    . REPLACEMENT TOTAL KNEE BILATERAL      Current Outpatient Prescriptions  Medication Sig Dispense Refill  . CALCIUM-VITAMIN D PO Take 1 tablet by mouth daily.     Marland Kitchen. docusate sodium (COLACE) 100 MG capsule Take 100 mg by mouth at bedtime. Dosage not specified     . doxycycline (DORYX) 100 MG DR capsule Take 100 mg by mouth daily.    . furosemide (LASIX) 20 MG tablet Take 20 mg by mouth daily as needed.      Marland Kitchen. HYDROcodone-acetaminophen (NORCO/VICODIN) 5-325 MG tablet Take 1 tablet by  mouth daily as needed.    . metoprolol succinate (TOPROL-XL) 50 MG 24 hr tablet TAKE 1 TABLET BY MOUTH EVERY DAY (STOP ATENOLOL) 90 tablet 3  . metroNIDAZOLE (METROCREAM) 0.75 % cream Apply 1 application topically daily.      . Multiple Vitamin (MULTIVITAMIN) tablet Take 1 tablet by mouth daily.      . potassium chloride SA (K-DUR,KLOR-CON) 20 MEQ tablet Take 20 mEq by mouth daily.    . Psyllium (METAMUCIL) 30.9 % POWD Take by mouth daily.      Marland Kitchen. warfarin (COUMADIN) 5 MG tablet Take 5 mg by mouth daily. Managed by Dr. Leandrew KoyanagiBurdine.  UAD.    . flecainide (TAMBOCOR) 50 MG tablet Take 1 tablet (50 mg total) by mouth 2 (two) times daily. (Patient not taking: Reported on 03/30/2017) 180 tablet 3   No current facility-administered medications for this visit.    Allergies:  Penicillins; Cephalexin; and Digoxin   Social History: The patient  reports that she has never smoked. She has never used smokeless tobacco. She reports that she does not drink alcohol or use drugs.   ROS:  Please see the history of present illness. Otherwise, complete review of systems is positive for arthritic pain and stiffness in her back and legs.  All other systems are  reviewed and negative.   Physical Exam: VS:  BP 132/80   Pulse (!) 59   Ht 5\' 2"  (1.575 m)   Wt 147 lb 3.2 oz (66.8 kg)   SpO2 96%   BMI 26.92 kg/m , BMI Body mass index is 26.92 kg/m.  Wt Readings from Last 3 Encounters:  03/30/17 147 lb 3.2 oz (66.8 kg)  12/28/16 148 lb (67.1 kg)  12/11/16 150 lb (68 kg)    General: Elderly woman, appears comfortable at rest. HEENT: Conjunctiva and lids normal, oropharynx clear. Neck: Supple, no elevated JVP or carotid bruits, no thyromegaly. Lungs: Clear to auscultation, nonlabored breathing at rest. Cardiac: Regular rate and rhythm with rare ectopy, no S3 or significant systolic murmur. Abdomen: Soft, nontender, bowel sounds present, no guarding or rebound. Extremities: No pitting edema, distal pulses 2+. Skin:  Warm and dry. Musculoskeletal: No kyphosis. Neuropsychiatric: Alert and oriented x3, affect grossly appropriate.  ECG: I personally reviewed the tracing from 01/11/2017 which showed sinus bradycardia with left anterior fascicular block and QRS 112 ms.  Recent Labwork:  June 2018: Cholesterol 223, triglycerides 149, HDL 62, LDL 131, BUN 24, creatinine 0.89, potassium 4.2, AST 21, ALT 13, hemoglobin 13.9, platelets 199, TSH 3.48, hemoglobin A1c 6.1  Other Studies Reviewed Today:  Lexiscan Myoview 03/16/2016 [facility]): No myocardial perfusion defects to indicate scar or ischemia, LVEF 69%.  Assessment and Plan:  1.  Paroxysmal atrial fibrillation.  Reduce Toprol-XL to 25 mg daily, continue Coumadin, and reinitiate flecainide 50 mill grams twice daily.  2.  Asymptomatic PFO.  3.  Mild intermittent leg edema.  No progression.  He has Lasix available as needed.  4.  Diet-controlled type 2 diabetes mellitus, followed by Dr. Leandrew Koyanagi.  Current medicines were reviewed with the patient today.  Disposition: We will up in 3 months.  Signed, Jonelle Sidle, MD, [facility] 03/30/2017 10:03 AM    [facility] at [facility] [address] [place], [place], [place] [address] Phone: [phone]; Fax: [phone]

## 2017-03-30 ENCOUNTER — Ambulatory Visit (INDEPENDENT_AMBULATORY_CARE_PROVIDER_SITE_OTHER): Payer: Medicare Other | Admitting: Cardiology

## 2017-03-30 ENCOUNTER — Encounter: Payer: Self-pay | Admitting: Cardiology

## 2017-03-30 VITALS — BP 132/80 | HR 59 | Ht 62.0 in | Wt 147.2 lb

## 2017-03-30 DIAGNOSIS — Q211 Atrial septal defect: Secondary | ICD-10-CM

## 2017-03-30 DIAGNOSIS — I208 Other forms of angina pectoris: Secondary | ICD-10-CM | POA: Diagnosis not present

## 2017-03-30 DIAGNOSIS — I480 Paroxysmal atrial fibrillation: Secondary | ICD-10-CM | POA: Diagnosis not present

## 2017-03-30 DIAGNOSIS — R600 Localized edema: Secondary | ICD-10-CM

## 2017-03-30 DIAGNOSIS — Q2112 Patent foramen ovale: Secondary | ICD-10-CM

## 2017-03-30 DIAGNOSIS — E119 Type 2 diabetes mellitus without complications: Secondary | ICD-10-CM | POA: Diagnosis not present

## 2017-03-30 MED ORDER — FLECAINIDE ACETATE 50 MG PO TABS: 50.0000 mg | ORAL_TABLET | Freq: Two times a day (BID) | ORAL | 0 refills | Status: DC

## 2017-03-30 MED ORDER — METOPROLOL SUCCINATE ER 25 MG PO TB24: 25.0000 mg | ORAL_TABLET | Freq: Every day | ORAL | 3 refills | Status: DC

## 2017-03-30 NOTE — Patient Instructions (Addendum)
Medication Instructions:  Your physician has recommended you make the following change in your medication:   Decrease your Toprol XL to 25 mg daily  Restart Flecainide 50 mg twice daily  Please continue all other medications as prescribed  Labwork: NONE  Testing/Procedures: NONE  Follow-Up: Your physician recommends that you schedule a follow-up appointment in: 3 MONTHS WITH DR. MCDOWELL  Any Other Special Instructions Will Be Listed Below (If Applicable).  If you need a refill on your cardiac medications before your next appointment, please call your pharmacy.

## 2017-05-22 ENCOUNTER — Emergency Department (HOSPITAL_COMMUNITY): Payer: Medicare Other

## 2017-05-22 ENCOUNTER — Inpatient Hospital Stay (HOSPITAL_COMMUNITY)
Admission: EM | Admit: 2017-05-22 | Discharge: 2017-05-25 | DRG: 481 | Disposition: A | Discharge status: Skilled Nursing Facility | Payer: Medicare Other | Attending: Internal Medicine | Admitting: Internal Medicine

## 2017-05-22 ENCOUNTER — Other Ambulatory Visit: Payer: Self-pay

## 2017-05-22 DIAGNOSIS — Z881 Allergy status to other antibiotic agents status: Secondary | ICD-10-CM | POA: Diagnosis not present

## 2017-05-22 DIAGNOSIS — Y92009 Unspecified place in unspecified non-institutional (private) residence as the place of occurrence of the external cause: Secondary | ICD-10-CM

## 2017-05-22 DIAGNOSIS — E1151 Type 2 diabetes mellitus with diabetic peripheral angiopathy without gangrene: Secondary | ICD-10-CM | POA: Diagnosis present

## 2017-05-22 DIAGNOSIS — Z79899 Other long term (current) drug therapy: Secondary | ICD-10-CM | POA: Diagnosis not present

## 2017-05-22 DIAGNOSIS — S72012A Unspecified intracapsular fracture of left femur, initial encounter for closed fracture: Principal | ICD-10-CM | POA: Diagnosis present

## 2017-05-22 DIAGNOSIS — M1990 Unspecified osteoarthritis, unspecified site: Secondary | ICD-10-CM | POA: Diagnosis present

## 2017-05-22 DIAGNOSIS — Z8673 Personal history of transient ischemic attack (TIA), and cerebral infarction without residual deficits: Secondary | ICD-10-CM | POA: Diagnosis not present

## 2017-05-22 DIAGNOSIS — Z888 Allergy status to other drugs, medicaments and biological substances status: Secondary | ICD-10-CM | POA: Diagnosis not present

## 2017-05-22 DIAGNOSIS — I4891 Unspecified atrial fibrillation: Secondary | ICD-10-CM | POA: Diagnosis present

## 2017-05-22 DIAGNOSIS — Z7901 Long term (current) use of anticoagulants: Secondary | ICD-10-CM

## 2017-05-22 DIAGNOSIS — Z792 Long term (current) use of antibiotics: Secondary | ICD-10-CM

## 2017-05-22 DIAGNOSIS — Z880 Allergy status to penicillin: Secondary | ICD-10-CM

## 2017-05-22 DIAGNOSIS — I739 Peripheral vascular disease, unspecified: Secondary | ICD-10-CM | POA: Diagnosis present

## 2017-05-22 DIAGNOSIS — K5900 Constipation, unspecified: Secondary | ICD-10-CM | POA: Diagnosis present

## 2017-05-22 DIAGNOSIS — W19XXXA Unspecified fall, initial encounter: Secondary | ICD-10-CM | POA: Diagnosis present

## 2017-05-22 DIAGNOSIS — S72002G Fracture of unspecified part of neck of left femur, subsequent encounter for closed fracture with delayed healing: Secondary | ICD-10-CM | POA: Diagnosis not present

## 2017-05-22 DIAGNOSIS — Z96653 Presence of artificial knee joint, bilateral: Secondary | ICD-10-CM | POA: Diagnosis present

## 2017-05-22 DIAGNOSIS — S72002A Fracture of unspecified part of neck of left femur, initial encounter for closed fracture: Secondary | ICD-10-CM | POA: Diagnosis present

## 2017-05-22 DIAGNOSIS — Z419 Encounter for procedure for purposes other than remedying health state, unspecified: Secondary | ICD-10-CM

## 2017-05-22 DIAGNOSIS — Z91040 Latex allergy status: Secondary | ICD-10-CM | POA: Diagnosis not present

## 2017-05-22 DIAGNOSIS — L719 Rosacea, unspecified: Secondary | ICD-10-CM | POA: Diagnosis present

## 2017-05-22 DIAGNOSIS — I482 Chronic atrial fibrillation: Secondary | ICD-10-CM | POA: Diagnosis present

## 2017-05-22 DIAGNOSIS — R402414 Glasgow coma scale score 13-15, 24 hours or more after hospital admission: Secondary | ICD-10-CM | POA: Diagnosis not present

## 2017-05-22 DIAGNOSIS — Q211 Atrial septal defect: Secondary | ICD-10-CM

## 2017-05-22 DIAGNOSIS — I480 Paroxysmal atrial fibrillation: Secondary | ICD-10-CM | POA: Diagnosis present

## 2017-05-22 LAB — CBC WITH DIFFERENTIAL/PLATELET
BASOS ABS: 0 10*3/uL (ref 0.0–0.1)
Basophils Relative: 0 %
EOS ABS: 0 10*3/uL (ref 0.0–0.7)
EOS PCT: 0 %
HCT: 41.5 % (ref 36.0–46.0)
HEMOGLOBIN: 13.9 g/dL (ref 12.0–15.0)
LYMPHS PCT: 14 %
Lymphs Abs: 1.5 10*3/uL (ref 0.7–4.0)
MCH: 32.3 pg (ref 26.0–34.0)
MCHC: 33.5 g/dL (ref 30.0–36.0)
MCV: 96.5 fL (ref 78.0–100.0)
Monocytes Absolute: 0.5 10*3/uL (ref 0.1–1.0)
Monocytes Relative: 5 %
NEUTROS PCT: 81 %
Neutro Abs: 8.4 10*3/uL — ABNORMAL HIGH (ref 1.7–7.7)
PLATELETS: 172 10*3/uL (ref 150–400)
RBC: 4.3 MIL/uL (ref 3.87–5.11)
RDW: 13.9 % (ref 11.5–15.5)
WBC: 10.5 10*3/uL (ref 4.0–10.5)

## 2017-05-22 LAB — TYPE AND SCREEN
ABO/RH(D): AB POS
ANTIBODY SCREEN: NEGATIVE

## 2017-05-22 LAB — PROTIME-INR
INR: 2.05
PROTHROMBIN TIME: 23 s — AB (ref 11.4–15.2)

## 2017-05-22 LAB — BASIC METABOLIC PANEL
Anion gap: 8 (ref 5–15)
BUN: 29 mg/dL — AB (ref 6–20)
CO2: 27 mmol/L (ref 22–32)
Calcium: 9 mg/dL (ref 8.9–10.3)
Chloride: 103 mmol/L (ref 101–111)
Creatinine, Ser: 0.85 mg/dL (ref 0.44–1.00)
GFR, EST NON AFRICAN AMERICAN: 60 mL/min — AB (ref >60)
Glucose, Bld: 142 mg/dL — ABNORMAL HIGH (ref 65–99)
POTASSIUM: 3.8 mmol/L (ref 3.5–5.1)
SODIUM: 138 mmol/L (ref 135–145)

## 2017-05-22 MED ORDER — HYDROCODONE-ACETAMINOPHEN 5-325 MG PO TABS
1.0000 | ORAL_TABLET | Freq: Four times a day (QID) | ORAL | Status: DC | PRN
  Administered 2017-05-22 – 2017-05-24 (×3): 1 via ORAL
  Filled 2017-05-22: qty 2
  Filled 2017-05-22 (×2): qty 1

## 2017-05-22 MED ORDER — ADULT MULTIVITAMIN W/MINERALS CH
1.0000 | ORAL_TABLET | Freq: Every day | ORAL | Status: DC
  Administered 2017-05-24 – 2017-05-25 (×2): 1 via ORAL
  Filled 2017-05-22 (×2): qty 1

## 2017-05-22 MED ORDER — POVIDONE-IODINE 10 % EX SWAB: 2.0000 "application " | Freq: Once | CUTANEOUS | Status: DC

## 2017-05-22 MED ORDER — CLINDAMYCIN PHOSPHATE 900 MG/50ML IV SOLN
900.0000 mg | INTRAVENOUS | Status: AC
  Administered 2017-05-23: 900 mg via INTRAVENOUS
  Filled 2017-05-22: qty 50

## 2017-05-22 MED ORDER — METRONIDAZOLE 0.75 % EX GEL
1.0000 "application " | Freq: Every day | CUTANEOUS | Status: DC
  Administered 2017-05-24 – 2017-05-25 (×2): 1 via TOPICAL
  Filled 2017-05-22: qty 45

## 2017-05-22 MED ORDER — DOCUSATE SODIUM 100 MG PO CAPS
100.0000 mg | ORAL_CAPSULE | Freq: Every day | ORAL | Status: DC
  Administered 2017-05-23 – 2017-05-24 (×3): 100 mg via ORAL
  Filled 2017-05-22 (×3): qty 1

## 2017-05-22 MED ORDER — FLECAINIDE ACETATE 50 MG PO TABS
50.0000 mg | ORAL_TABLET | Freq: Two times a day (BID) | ORAL | Status: DC
  Administered 2017-05-22 – 2017-05-25 (×6): 50 mg via ORAL
  Filled 2017-05-22 (×6): qty 1

## 2017-05-22 MED ORDER — VITAMIN K1 10 MG/ML IJ SOLN
1.0000 mg | Freq: Once | INTRAVENOUS | Status: AC
  Administered 2017-05-22: 1 mg via INTRAVENOUS
  Filled 2017-05-22: qty 0.1

## 2017-05-22 MED ORDER — METOPROLOL SUCCINATE ER 25 MG PO TB24
25.0000 mg | ORAL_TABLET | Freq: Every day | ORAL | Status: DC
Start: 2017-05-22 — End: 2017-05-25
  Administered 2017-05-22 – 2017-05-24 (×2): 25 mg via ORAL
  Filled 2017-05-22 (×2): qty 1

## 2017-05-22 MED ORDER — FENTANYL CITRATE (PF) 100 MCG/2ML IJ SOLN
50.0000 ug | Freq: Once | INTRAMUSCULAR | Status: AC
  Administered 2017-05-22: 50 ug via INTRAVENOUS
  Filled 2017-05-22: qty 2

## 2017-05-22 MED ORDER — MORPHINE SULFATE (PF) 2 MG/ML IV SOLN: 0.5000 mg | INTRAVENOUS | Status: DC | PRN

## 2017-05-22 MED ORDER — POTASSIUM CHLORIDE CRYS ER 20 MEQ PO TBCR: 20.0000 meq | EXTENDED_RELEASE_TABLET | Freq: Every day | ORAL | Status: DC

## 2017-05-22 MED ORDER — CHLORHEXIDINE GLUCONATE 4 % EX LIQD
60.0000 mL | Freq: Once | CUTANEOUS | Status: DC
  Filled 2017-05-22: qty 60

## 2017-05-22 MED ORDER — DOXYCYCLINE HYCLATE 100 MG PO TABS: 100.0000 mg | ORAL_TABLET | Freq: Every day | ORAL | Status: DC

## 2017-05-22 MED ORDER — PSYLLIUM 95 % PO PACK
PACK | Freq: Every day | ORAL | Status: DC
  Administered 2017-05-24 – 2017-05-25 (×2): 1 via ORAL
  Filled 2017-05-22 (×2): qty 1

## 2017-05-22 MED ORDER — CALCIUM CARBONATE-VITAMIN D 500-200 MG-UNIT PO TABS
1.0000 | ORAL_TABLET | Freq: Every day | ORAL | Status: DC
  Administered 2017-05-24 – 2017-05-25 (×2): 1 via ORAL
  Filled 2017-05-22 (×2): qty 1

## 2017-05-22 NOTE — ED Notes (Signed)
Aram BeechamWayne harris  Family contact would like to be notified where patient goes from here  cell [phone] Home [phone]

## 2017-05-22 NOTE — ED Notes (Signed)
ED TO INPATIENT HANDOFF REPORT  Name/Age/Gender [name] [name] 83 y.o. female  Code Status    Code Status Orders  (From admission, onward)        Start     Ordered   05/22/17 2046  Full code  Continuous     05/22/17 2050    Code Status History    Date Active Date Inactive Code Status Order ID Comments User Context   This patient has a current code status but no historical code status.    Advance Directive Documentation     Most Recent Value  Type of Advance Directive  Living will, Healthcare Power of Attorney  Pre-existing out of facility DNR order (yellow form or pink MOST form)  No data  "MOST" Form in Place?  No data      Home/SNF/Other Home  Chief Complaint fall/left hip pain  Level of Care/Admitting Diagnosis ED Disposition    ED Disposition Condition Comment   Admit  Hospital Area: [facility] [[id number]]  Level of Care: Med-Surg [16]  Diagnosis: Fracture of femoral neck, left, closed [facility]) [[id number]]  Admitting Physician: Doreatha Massed  Attending Physician: Etta Quill [phone]  Estimated length of stay: past midnight tomorrow  Certification:: I certify this patient will need inpatient services for at least 2 midnights  PT Class (Do Not Modify): Inpatient [101]  PT Acc Code (Do Not Modify): Private [1]       Medical History Past Medical History:  Diagnosis Date  . Chronic edema   . History of cardiovascular stress test    Negative dobutamine echo 6/13  . Osteoarthritis   . Paroxysmal atrial fibrillation (HCC)   . PFO (patent foramen ovale)   . Rosacea   . Type 2 diabetes mellitus (HCC)     Allergies Allergies  Allergen Reactions  . Penicillins Hives  . Cephalexin Nausea And Vomiting    REACTION: nausea REACTION: nausea  . Digoxin     REACTION: rash on legs    IV Location/Drains/Wounds Patient Lines/Drains/Airways Status   Active Line/Drains/Airways    Name:   Placement date:   Placement time:   Site:    Days:   Peripheral IV 05/22/17 Left Antecubital   05/22/17    2118    Antecubital   less than 1          Labs/Imaging Results for orders placed or performed during the hospital encounter of 05/22/17 (from the past 48 hour(s))  Basic metabolic panel     Status: Abnormal   Collection Time: 05/22/17  7:42 PM  Result Value Ref Range   Sodium 138 135 - 145 mmol/L   Potassium 3.8 3.5 - 5.1 mmol/L   Chloride 103 101 - 111 mmol/L   CO2 27 22 - 32 mmol/L   Glucose, Bld 142 (H) 65 - 99 mg/dL   BUN 29 (H) 6 - 20 mg/dL   Creatinine, Ser 0.85 0.44 - 1.00 mg/dL   Calcium 9.0 8.9 - 10.3 mg/dL   GFR calc non Af Amer 60 (L) >60 mL/min   GFR calc Af Amer >60 >60 mL/min    Comment: (NOTE) The eGFR has been calculated using the CKD EPI equation. This calculation has not been validated in all clinical situations. eGFR's persistently <60 mL/min signify possible Chronic Kidney Disease.    Anion gap 8 5 - 15  CBC WITH DIFFERENTIAL     Status: Abnormal   Collection Time: 05/22/17  7:42 PM  Result Value  Ref Range   WBC 10.5 4.0 - 10.5 K/uL   RBC 4.30 3.87 - 5.11 MIL/uL   Hemoglobin 13.9 12.0 - 15.0 g/dL   HCT 41.5 36.0 - 46.0 %   MCV 96.5 78.0 - 100.0 fL   MCH 32.3 26.0 - 34.0 pg   MCHC 33.5 30.0 - 36.0 g/dL   RDW 13.9 11.5 - 15.5 %   Platelets 172 150 - 400 K/uL   Neutrophils Relative % 81 %   Neutro Abs 8.4 (H) 1.7 - 7.7 K/uL   Lymphocytes Relative 14 %   Lymphs Abs 1.5 0.7 - 4.0 K/uL   Monocytes Relative 5 %   Monocytes Absolute 0.5 0.1 - 1.0 K/uL   Eosinophils Relative 0 %   Eosinophils Absolute 0.0 0.0 - 0.7 K/uL   Basophils Relative 0 %   Basophils Absolute 0.0 0.0 - 0.1 K/uL  Protime-INR     Status: Abnormal   Collection Time: 05/22/17  7:42 PM  Result Value Ref Range   Prothrombin Time 23.0 (H) 11.4 - 15.2 seconds   INR 2.05   Type and screen [place]     Status: None   Collection Time: 05/22/17  7:42 PM  Result Value Ref Range   ABO/RH(D) AB POS     Antibody Screen NEG    Sample Expiration 05/25/2017    Ct Hip Left Wo Contrast  Result Date: 05/22/2017 CLINICAL DATA:  83 year old female with left hip fracture. EXAM: CT OF THE LEFT HIP WITHOUT CONTRAST TECHNIQUE: Multidetector CT imaging of the left hip was performed according to the standard protocol. Multiplanar CT image reconstructions were also generated. COMPARISON:  Left hip radiograph dated 05/22/2017 FINDINGS: Bones/Joint/Cartilage Evaluation for fracture is limited due to advanced osteopenia. There is a nondisplaced fracture of the left femoral neck. There is no dislocation. Ligaments Suboptimally assessed by CT. Muscles and Tendons No acute findings.  No intramuscular hematoma. Soft tissues Sigmoid diverticulosis. The soft tissues are otherwise unremarkable. IMPRESSION: Nondisplaced left femoral neck fracture. Electronically Signed   By: Anner Crete M.D.   On: 05/22/2017 20:56   Dg Chest Port 1 View  Result Date: 05/22/2017 CLINICAL DATA:  Status post fall, left hip fracture EXAM: PORTABLE CHEST 1 VIEW COMPARISON:  None. FINDINGS: There is no focal parenchymal opacity. There is no pleural effusion or pneumothorax. There is stable cardiomegaly. There is thoracic aortic atherosclerosis. The osseous structures are unremarkable. IMPRESSION: No active disease. Electronically Signed   By: Kathreen Devoid   On: 05/22/2017 19:57   Dg Hip Unilat W Or Wo Pelvis 2-3 Views Left  Result Date: 05/22/2017 CLINICAL DATA:  Status post fall, pain EXAM: DG HIP (WITH OR WITHOUT PELVIS) 2-3V LEFT COMPARISON:  None. FINDINGS: Nondisplaced left subcapital femoral neck fracture. No other fracture or dislocation. Generalized osteopenia. Prior ORIF of a right femoral neck fracture. Lower lumbar spine spondylosis. IMPRESSION: Nondisplaced left subcapital femoral neck fracture. Electronically Signed   By: Kathreen Devoid   On: 05/22/2017 18:56    Pending Labs Unresulted Labs (From admission, onward)   None       Vitals/Pain Today's Vitals   05/22/17 1903 05/22/17 1908 05/22/17 2101 05/22/17 2154  BP: (!) 177/75  (!) 170/68   Pulse: 74  73   Resp: 18  19   Temp:      TempSrc:      SpO2: 97%  94%   Weight:      Height:      PainSc:  8  2     Isolation Precautions No active isolations  Medications Medications  HYDROcodone-acetaminophen (NORCO/VICODIN) 5-325 MG per tablet 1-2 tablet (not administered)  morphine 2 MG/ML injection 0.5 mg (not administered)  phytonadione (VITAMIN K) 1 mg in dextrose 5 % 50 mL IVPB (not administered)  docusate sodium (COLACE) capsule 100 mg (not administered)  calcium-vitamin D 500-200 MG-UNIT per tablet 1 tablet (not administered)  flecainide (TAMBOCOR) tablet 50 mg (not administered)  metoprolol succinate (TOPROL-XL) 24 hr tablet 25 mg (not administered)  metroNIDAZOLE (METROCREAM) 3.00 % cream 1 application (not administered)  multivitamin tablet 1 tablet (not administered)  Psyllium 30.9 % POWD (not administered)  doxycycline (DORYX) DR capsule 100 mg (not administered)  fentaNYL (SUBLIMAZE) injection 50 mcg (50 mcg Intravenous Given 05/22/17 2026)    Mobility walks with device

## 2017-05-22 NOTE — H&P (Signed)
History and Physical    [name] [name]:[id number] DOB: April 06, 1928 DOA: 05/22/2017  PCP: Juliette Alcide, MD  Patient coming from: Home  I have personally briefly reviewed patient's old medical records in [facility]  Chief Complaint: L hip fx  HPI: [name] [name] is a 83 y.o. female with medical history significant of PAF on coumadin.  Patient presents to the ED following mechanical fall at home.  Severe L hip pain after fall.  Persistent, constant.  Didn't hit head in fall.   ED Course: Left Hip fx   Review of Systems: As per HPI otherwise 10 point review of systems negative.   Past Medical History:  Diagnosis Date  . Chronic edema   . History of cardiovascular stress test    Negative dobutamine echo 6/13  . Osteoarthritis   . Paroxysmal atrial fibrillation (HCC)   . PFO (patent foramen ovale)   . Rosacea   . Type 2 diabetes mellitus (HCC)     Past Surgical History:  Procedure Laterality Date  . ANKLE SURGERY Left   . HIP SURGERY Right   . PARTIAL HYSTERECTOMY     fibroid tumors   . RECTOCELE REPAIR    . REPLACEMENT TOTAL KNEE BILATERAL       reports that  has never smoked. she has never used smokeless tobacco. She reports that she does not drink alcohol or use drugs.  Allergies  Allergen Reactions  . Penicillins Hives  . Cephalexin Nausea And Vomiting    REACTION: nausea REACTION: nausea  . Digoxin     REACTION: rash on legs    Family History  Problem Relation Age of Onset  . Heart attack Mother   . Other Father        blood poisioning  . Heart attack Maternal Grandmother      Prior to Admission medications   Medication Sig Start Date End Date Taking? Authorizing Provider  CALCIUM-VITAMIN D PO Take 1 tablet by mouth daily.    Yes [provider]  docusate sodium (COLACE) 100 MG capsule Take 100 mg by mouth at bedtime. Dosage not specified    Yes [provider]  doxycycline (DORYX) 100 MG DR capsule Take 100 mg by  mouth daily.   Yes [provider]  flecainide (TAMBOCOR) 50 MG tablet Take 1 tablet (50 mg total) by mouth 2 (two) times daily. 03/30/17  Yes Jonelle Sidle, MD  furosemide (LASIX) 20 MG tablet Take 20 mg by mouth daily as needed.     Yes [provider]  metoprolol succinate (TOPROL XL) 25 MG 24 hr tablet Take 1 tablet (25 mg total) by mouth daily. 03/30/17  Yes Jonelle Sidle, MD  metroNIDAZOLE (METROCREAM) 0.75 % cream Apply 1 application topically daily.     Yes [provider]  Multiple Vitamin (MULTIVITAMIN) tablet Take 1 tablet by mouth daily.     Yes [provider]  potassium chloride SA (K-DUR,KLOR-CON) 20 MEQ tablet Take 20 mEq by mouth daily.   Yes [provider]  Psyllium (METAMUCIL) 30.9 % POWD Take by mouth daily.     Yes [provider]  warfarin (COUMADIN) 5 MG tablet Take 5 mg by mouth daily. Managed by Dr. Leandrew Koyanagi.  UAD. M& F 2.5 mg TWTh, SaSu: 5 mg   Yes [provider]    Physical Exam: Vitals:   05/22/17 1652 05/22/17 1657 05/22/17 1903 05/22/17 2101  BP:   (!) 177/75 (!) 170/68  Pulse:  74 73  Resp:   18 19  Temp:      TempSrc:      SpO2:  100% 97% 94%  Weight: 65.8 kg (145 lb)     Height: 5\' 2"  (1.575 m)       Constitutional: NAD, calm, comfortable Eyes: PERRL, lids and conjunctivae normal ENMT: Mucous membranes are moist. Posterior pharynx clear of any exudate or lesions.Normal dentition.  Neck: normal, supple, no masses, no thyromegaly Respiratory: clear to auscultation bilaterally, no wheezing, no crackles. Normal respiratory effort. No accessory muscle use.  Cardiovascular: Regular rate and rhythm, no murmurs / rubs / gallops. No extremity edema. 2+ pedal pulses. No carotid bruits.  Abdomen: no tenderness, no masses palpated. No hepatosplenomegaly. Bowel sounds positive.  Musculoskeletal: no clubbing / cyanosis. No joint deformity upper and lower extremities. Good ROM, no contractures.  Normal muscle tone.  Skin: no rashes, lesions, ulcers. No induration Neurologic: CN 2-12 grossly intact. Sensation intact, DTR normal. Strength 5/5 in all 4.  Psychiatric: Normal judgment and insight. Alert and oriented x 3. Normal mood.    Labs on Admission: I have personally reviewed following labs and imaging studies  CBC: Recent Labs  Lab 05/22/17 1942  WBC 10.5  NEUTROABS 8.4*  HGB 13.9  HCT 41.5  MCV 96.5  PLT 172   Basic Metabolic Panel: Recent Labs  Lab 05/22/17 1942  NA 138  K 3.8  CL 103  CO2 27  GLUCOSE 142*  BUN 29*  CREATININE 0.85  CALCIUM 9.0   GFR: Estimated Creatinine Clearance: 41.5 mL/min (by C-G formula based on SCr of 0.85 mg/dL). Liver Function Tests: No results for input(s): AST, ALT, ALKPHOS, BILITOT, PROT, ALBUMIN in the last 168 hours. No results for input(s): LIPASE, AMYLASE in the last 168 hours. No results for input(s): AMMONIA in the last 168 hours. Coagulation Profile: Recent Labs  Lab 05/22/17 1942  INR 2.05   Cardiac Enzymes: No results for input(s): CKTOTAL, CKMB, CKMBINDEX, TROPONINI in the last 168 hours. BNP (last 3 results) No results for input(s): PROBNP in the last 8760 hours. HbA1C: No results for input(s): HGBA1C in the last 72 hours. CBG: No results for input(s): GLUCAP in the last 168 hours. Lipid Profile: No results for input(s): CHOL, HDL, LDLCALC, TRIG, CHOLHDL, LDLDIRECT in the last 72 hours. Thyroid Function Tests: No results for input(s): TSH, T4TOTAL, FREET4, T3FREE, THYROIDAB in the last 72 hours. Anemia Panel: No results for input(s): VITAMINB12, FOLATE, FERRITIN, TIBC, IRON, RETICCTPCT in the last 72 hours. Urine analysis: No results found for: COLORURINE, APPEARANCEUR, LABSPEC, PHURINE, GLUCOSEU, HGBUR, BILIRUBINUR, KETONESUR, PROTEINUR, UROBILINOGEN, NITRITE, LEUKOCYTESUR  Radiological Exams on Admission: Ct Hip Left Wo Contrast  Result Date: 05/22/2017 CLINICAL DATA:  83 year old female with left  hip fracture. EXAM: CT OF THE LEFT HIP WITHOUT CONTRAST TECHNIQUE: Multidetector CT imaging of the left hip was performed according to the standard protocol. Multiplanar CT image reconstructions were also generated. COMPARISON:  Left hip radiograph dated 05/22/2017 FINDINGS: Bones/Joint/Cartilage Evaluation for fracture is limited due to advanced osteopenia. There is a nondisplaced fracture of the left femoral neck. There is no dislocation. Ligaments Suboptimally assessed by CT. Muscles and Tendons No acute findings.  No intramuscular hematoma. Soft tissues Sigmoid diverticulosis. The soft tissues are otherwise unremarkable. IMPRESSION: Nondisplaced left femoral neck fracture. Electronically Signed   By: Elgie CollardArash  Radparvar M.D.   On: 05/22/2017 20:56   Dg Chest Port 1 View  Result Date: 05/22/2017 CLINICAL DATA:  Status post fall, left hip fracture  EXAM: PORTABLE CHEST 1 VIEW COMPARISON:  None. FINDINGS: There is no focal parenchymal opacity. There is no pleural effusion or pneumothorax. There is stable cardiomegaly. There is thoracic aortic atherosclerosis. The osseous structures are unremarkable. IMPRESSION: No active disease. Electronically Signed   By: Elige KoHetal  Patel   On: 05/22/2017 19:57   Dg Hip Unilat W Or Wo Pelvis 2-3 Views Left  Result Date: 05/22/2017 CLINICAL DATA:  Status post fall, pain EXAM: DG HIP (WITH OR WITHOUT PELVIS) 2-3V LEFT COMPARISON:  None. FINDINGS: Nondisplaced left subcapital femoral neck fracture. No other fracture or dislocation. Generalized osteopenia. Prior ORIF of a right femoral neck fracture. Lower lumbar spine spondylosis. IMPRESSION: Nondisplaced left subcapital femoral neck fracture. Electronically Signed   By: Elige KoHetal  Patel   On: 05/22/2017 18:56    EKG: Independently reviewed.  Assessment/Plan Principal Problem:   Fracture of femoral neck, left, closed (HCC) Active Problems:   Atrial fibrillation (HCC)   Chronic anticoagulation    1. Non-displaced L femoral  neck fx - 1. Sounds like plan is to attempt pinning tomorrow afternoon some time 2. Hip fx pathway 3. NPO after MN 4. Pain ctrl per pathway 2. A.Fib on coumadin - 1. INR 2.0 2. Hold coumadin 3. Giving 1mg  vit K to reverse 4. Continue metoprolol and flecanide  DVT prophylaxis: SCDs, INR 2.0 currently Code Status: Full Family Communication: No family in room Disposition Plan: Probably rehab post admit Consults called: Ortho Admission status: Admit to inpatient   Hillary BowGARDNER, JARED M. DO [facility] Pager [phone]  If 7AM-7PM, please contact day team taking care of patient [email or web address] Password [email or web address]  05/22/2017, 9:36 PM

## 2017-05-22 NOTE — ED Triage Notes (Signed)
Pt brought to Ed via [facility] with c/o of fall from sitting position trying to get up and fell to left hip.  Pt c/o of hip pain but no deformity. Pt denies hitting her head. Pt is on warfarin and other blood thinners for hx of Afib. Pt also states she had a mini stroke "couple of weeks ago and is waiting for appointment with neurologist"  Vitals 177/71 98% RA 68 pulse

## 2017-05-22 NOTE — Consult Note (Signed)
ORTHOPAEDIC CONSULTATION  REQUESTING PHYSICIAN: Hillary BowGardner, Jared M, DO  PCP:  Juliette AlcideBurdine, Steven E, MD  Chief Complaint: Left hip fracture  HPI: [name] [name] is a 83 y.o. female who complains of left hip pain following a ground-level fall earlier today at her niece's house.  She states she lost balance while getting up from a round table and fell onto her left hip.  She does live independently at baseline but does use a cane for ambulation.  She denies any antecedent left hip pain.  She does have a history of right hip fracture that was treated with cannulated screws about 8 years ago.  Also of note she has chronic atrial fibrillation and has been on warfarin for anticoagulation therapy.  She is independent with all ADLs. Past Medical History:  Diagnosis Date  . Chronic edema   . History of cardiovascular stress test    Negative dobutamine echo 6/13  . Osteoarthritis   . Paroxysmal atrial fibrillation (HCC)   . PFO (patent foramen ovale)   . Rosacea   . Type 2 diabetes mellitus (HCC)    Past Surgical History:  Procedure Laterality Date  . ANKLE SURGERY Left   . HIP SURGERY Right   . PARTIAL HYSTERECTOMY     fibroid tumors   . RECTOCELE REPAIR    . REPLACEMENT TOTAL KNEE BILATERAL     Social History   Socioeconomic History  . Marital status: Widowed    Spouse name: Not on file  . Number of children: Not on file  . Years of education: Not on file  . Highest education level: Not on file  Social Needs  . Financial resource strain: Not on file  . Food insecurity - worry: Not on file  . Food insecurity - inability: Not on file  . Transportation needs - medical: Not on file  . Transportation needs - non-medical: Not on file  Occupational History  . Not on file  Tobacco Use  . Smoking status: Never Smoker  . Smokeless tobacco: Never Used  . Tobacco comment: does not smoke.   Substance and Sexual Activity  . Alcohol use: No  . Drug use: No  . Sexual activity: Not  Currently  Other Topics Concern  . Not on file  Social History Narrative   Widowed.    (medical history: coumadin therapy ; follow-up by Dr. Sherril CroonVyas )   Family History  Problem Relation Age of Onset  . Heart attack Mother   . Other Father        blood poisioning  . Heart attack Maternal Grandmother    Allergies  Allergen Reactions  . Penicillins Hives  . Cephalexin Nausea And Vomiting    REACTION: nausea REACTION: nausea  . Digoxin     REACTION: rash on legs   Prior to Admission medications   Medication Sig Start Date End Date Taking? Authorizing Provider  CALCIUM-VITAMIN D PO Take 1 tablet by mouth daily.    Yes [provider]  docusate sodium (COLACE) 100 MG capsule Take 100 mg by mouth at bedtime. Dosage not specified    Yes [provider]  doxycycline (DORYX) 100 MG DR capsule Take 100 mg by mouth daily.   Yes [provider]  flecainide (TAMBOCOR) 50 MG tablet Take 1 tablet (50 mg total) by mouth 2 (two) times daily. 03/30/17  Yes Jonelle SidleMcDowell, Samuel G, MD  furosemide (LASIX) 20 MG tablet Take 20 mg by mouth daily as needed.     Yes  [provider]  metoprolol succinate (TOPROL XL) 25 MG 24 hr tablet Take 1 tablet (25 mg total) by mouth daily. 03/30/17  Yes Jonelle SidleMcDowell, Samuel G, MD  metroNIDAZOLE (METROCREAM) 0.75 % cream Apply 1 application topically daily.     Yes [provider]  Multiple Vitamin (MULTIVITAMIN) tablet Take 1 tablet by mouth daily.     Yes [provider]  potassium chloride SA (K-DUR,KLOR-CON) 20 MEQ tablet Take 20 mEq by mouth daily.   Yes [provider]  Psyllium (METAMUCIL) 30.9 % POWD Take by mouth daily.     Yes [provider]  warfarin (COUMADIN) 5 MG tablet Take 5 mg by mouth daily. Managed by Dr. Leandrew KoyanagiBurdine.  UAD. M& F 2.5 mg TWTh, SaSu: 5 mg   Yes [provider]   Dg Chest Port 1 View  Result Date: 05/22/2017 CLINICAL DATA:  Status post fall, left hip fracture EXAM:  PORTABLE CHEST 1 VIEW COMPARISON:  None. FINDINGS: There is no focal parenchymal opacity. There is no pleural effusion or pneumothorax. There is stable cardiomegaly. There is thoracic aortic atherosclerosis. The osseous structures are unremarkable. IMPRESSION: No active disease. Electronically Signed   By: Elige KoHetal  Patel   On: 05/22/2017 19:57   Dg Hip Unilat W Or Wo Pelvis 2-3 Views Left  Result Date: 05/22/2017 CLINICAL DATA:  Status post fall, pain EXAM: DG HIP (WITH OR WITHOUT PELVIS) 2-3V LEFT COMPARISON:  None. FINDINGS: Nondisplaced left subcapital femoral neck fracture. No other fracture or dislocation. Generalized osteopenia. Prior ORIF of a right femoral neck fracture. Lower lumbar spine spondylosis. IMPRESSION: Nondisplaced left subcapital femoral neck fracture. Electronically Signed   By: Elige KoHetal  Patel   On: 05/22/2017 18:56    Positive ROS: All other systems have been reviewed and were otherwise negative with the exception of those mentioned in the HPI and as above.  Physical Exam: General: Alert, no acute distress Cardiovascular: No pedal edema Respiratory: No cyanosis, no use of accessory musculature GI: No organomegaly, abdomen is soft and non-tender Skin: No lesions in the area of chief complaint Neurologic: Sensation intact distally Psychiatric: Patient is competent for consent with normal mood and affect Lymphatic: No axillary or cervical lymphadenopathy  MUSCULOSKELETAL:  Physical examination left lower extremity:  No malalignment or deformity is noted.  She is tender around the hip girdle.  Distally in the calf and pretibial region she has some chronic venous stasis skin changes.  Nontender at the calf.  She is neurovascularly intact distal.  Assessment: 1.  Mildly valgus impacted left femoral neck fracture with no displacement.  Plan: -Our plan will be for operative management of this left hip fracture tomorrow afternoon.  We will plan for percutaneous pinning.  She is  on Coumadin and we appreciate reversal of that as much as possible before the time of surgery.  Given the percutaneous incision however we will proceed in a timely fashion. -I discussed the procedure with the patient at length, she is from air with the procedure having had it done on the right hip historically. - The risks, benefits, and alternatives were discussed with the patient. There are risks associated with the surgery including, but not limited to, problems with anesthesia (death), infection, differences in leg length/angulation/rotation, fracture of bones, loosening or failure of implants, malunion, nonunion, hematoma (blood accumulation) which may require surgical drainage, blood clots, pulmonary embolism, nerve injury (foot drop), and blood vessel injury. The patient understands these risks and elects to proceed. -Please make n.p.o. tonight at midnight  and hold warfarin until postop. -We appreciate the medical teams admission and perioperative management.     Yolonda Kida, MD Cell [phone]    05/22/2017 8:50 PM

## 2017-05-22 NOTE — ED Notes (Signed)
Pt asking for pain medication

## 2017-05-22 NOTE — ED Provider Notes (Signed)
[facility]-EMERGENCY DEPT Provider Note   CSN: [id number] Arrival date & time: 05/22/17  1633     History   Chief Complaint Chief Complaint  Patient presents with  . Fall    HPI [name] [name] is a 83 y.o. female.  HPI Patient presents with left hip and groin pain after fall today.  States that she lost balance while walking around the table and landed on her left hip.  Complaining of pain in her left hip.  States she does not hit her head.  States the pain goes from her left hip into her groin.  No back pain.  She is on anticoagulation.  No chest pain or trouble breathing. Past Medical History:  Diagnosis Date  . Chronic edema   . History of cardiovascular stress test    Negative dobutamine echo 6/13  . Osteoarthritis   . Paroxysmal atrial fibrillation (HCC)   . PFO (patent foramen ovale)   . Rosacea   . Type 2 diabetes mellitus [facility])     Patient Active Problem List   Diagnosis Date Noted  . Bilateral carotid artery disease (HCC) 03/10/2014  . Raynaud's phenomenon 03/17/2013  . Patent foramen ovale 05/15/2011  . Chronic anticoagulation 05/15/2011  . Atrial fibrillation (HCC) 04/28/2008    Past Surgical History:  Procedure Laterality Date  . ANKLE SURGERY Left   . HIP SURGERY Right   . PARTIAL HYSTERECTOMY     fibroid tumors   . RECTOCELE REPAIR    . REPLACEMENT TOTAL KNEE BILATERAL      OB History    No data available       Home Medications    Prior to Admission medications   Medication Sig Start Date End Date Taking? Authorizing Provider  CALCIUM-VITAMIN D PO Take 1 tablet by mouth daily.    Yes [provider]  docusate sodium (COLACE) 100 MG capsule Take 100 mg by mouth at bedtime. Dosage not specified    Yes [provider]  doxycycline (DORYX) 100 MG DR capsule Take 100 mg by mouth daily.   Yes [provider]  flecainide (TAMBOCOR) 50 MG tablet Take 1 tablet (50 mg total) by mouth 2 (two) times  daily. 03/30/17  Yes Jonelle Sidle, MD  furosemide (LASIX) 20 MG tablet Take 20 mg by mouth daily as needed.     Yes [provider]  metoprolol succinate (TOPROL XL) 25 MG 24 hr tablet Take 1 tablet (25 mg total) by mouth daily. 03/30/17  Yes Jonelle Sidle, MD  metroNIDAZOLE (METROCREAM) 0.75 % cream Apply 1 application topically daily.     Yes [provider]  Multiple Vitamin (MULTIVITAMIN) tablet Take 1 tablet by mouth daily.     Yes [provider]  potassium chloride SA (K-DUR,KLOR-CON) 20 MEQ tablet Take 20 mEq by mouth daily.   Yes [provider]  Psyllium (METAMUCIL) 30.9 % POWD Take by mouth daily.     Yes [provider]  warfarin (COUMADIN) 5 MG tablet Take 5 mg by mouth daily. Managed by Dr. Leandrew Koyanagi.  UAD. M& F 2.5 mg TWTh, SaSu: 5 mg   Yes [provider]    Family History Family History  Problem Relation Age of Onset  . Heart attack Mother   . Other Father        blood poisioning  . Heart attack Maternal Grandmother     Social History Social History   Tobacco Use  . Smoking status: Never  Smoker  . Smokeless tobacco: Never Used  . Tobacco comment: does not smoke.   Substance Use Topics  . Alcohol use: No  . Drug use: No     Allergies   Penicillins; Cephalexin; and Digoxin   Review of Systems Review of Systems  Constitutional: Negative for appetite change.  HENT: Negative for congestion.   Respiratory: Negative for shortness of breath.   Cardiovascular: Negative for chest pain.  Endocrine: Negative for polyuria.  Musculoskeletal: Positive for gait problem. Negative for back pain and neck pain.  Neurological: Negative for weakness and numbness.  Psychiatric/Behavioral: Negative for confusion.     Physical Exam Updated Vital Signs BP (!) 177/75 (BP Location: Right Arm)   Pulse 74   Temp 98.8 F (37.1 C) (Oral)   Resp 18   Ht 5\' 2"  (1.575 m)   Wt 65.8 kg (145 lb)   SpO2 97%   BMI  26.52 kg/m   Physical Exam  Constitutional: She appears well-developed.  HENT:  Head: Atraumatic.  Eyes: Pupils are equal, round, and reactive to light.  Neck: Neck supple.  Cardiovascular: Normal rate.  Pulmonary/Chest: She exhibits no tenderness.  Abdominal: Soft.  Musculoskeletal: She exhibits tenderness.  Tenderness to left hip.  No tenderness over lumbar spine.  Cervical spine nontender.  Upper and right lower extremities without tenderness.  Decreased range of motion in hip.  Neurovascular intact in left foot.  Decreased range of motion left hip due to pain.  Some left groin tenderness also.  Pelvis appears stable.  Skin: Skin is warm. Capillary refill takes less than 2 seconds.  Psychiatric: She has a normal mood and affect.     ED Treatments / Results  Labs (all labs ordered are listed, but only abnormal results are displayed) Labs Reviewed  BASIC METABOLIC PANEL - Abnormal; Notable for the following components:      Result Value   Glucose, Bld 142 (*)    BUN 29 (*)    GFR calc non Af Amer 60 (*)    All other components within normal limits  CBC WITH DIFFERENTIAL/PLATELET - Abnormal; Notable for the following components:   Neutro Abs 8.4 (*)    All other components within normal limits  PROTIME-INR - Abnormal; Notable for the following components:   Prothrombin Time 23.0 (*)    All other components within normal limits  TYPE AND SCREEN    EKG  EKG Interpretation  Date/Time:  Tuesday May 22 2017 19:05:47 EST Ventricular Rate:  72 PR Interval:    QRS Duration: 104 QT Interval:  416 QTC Calculation: 456 R Axis:   -74 Text Interpretation:  Sinus rhythm Left anterior fascicular block Consider anterior infarct Confirmed by Benjiman CorePickering, Alessio Bogan [phone]([id number]) on 05/22/2017 8:37:58 PM       Radiology Dg Chest Port 1 View  Result Date: 05/22/2017 CLINICAL DATA:  Status post fall, left hip fracture EXAM: PORTABLE CHEST 1 VIEW COMPARISON:  None. FINDINGS: There is no  focal parenchymal opacity. There is no pleural effusion or pneumothorax. There is stable cardiomegaly. There is thoracic aortic atherosclerosis. The osseous structures are unremarkable. IMPRESSION: No active disease. Electronically Signed   By: Elige KoHetal  Patel   On: 05/22/2017 19:57   Dg Hip Unilat W Or Wo Pelvis 2-3 Views Left  Result Date: 05/22/2017 CLINICAL DATA:  Status post fall, pain EXAM: DG HIP (WITH OR WITHOUT PELVIS) 2-3V LEFT COMPARISON:  None. FINDINGS: Nondisplaced left subcapital femoral neck fracture. No other fracture or dislocation. Generalized osteopenia. Prior ORIF  of a right femoral neck fracture. Lower lumbar spine spondylosis. IMPRESSION: Nondisplaced left subcapital femoral neck fracture. Electronically Signed   By: Elige KoHetal  Patel   On: 05/22/2017 18:56    Procedures Procedures (including critical care time)  Medications Ordered in ED Medications  fentaNYL (SUBLIMAZE) injection 50 mcg (50 mcg Intravenous Given 05/22/17 2026)     Initial Impression / Assessment and Plan / ED Course  I have reviewed the triage vital signs and the nursing notes.  Pertinent labs & imaging results that were available during my care of the patient were reviewed by me and considered in my medical decision making (see chart for details).     Patient with fall and left hip pain.  Has hip fracture.  Discussed with Dr. Aundria Rudogers, who will see the patient in consult.  Patient has paroxysmal A. fib although she is in sinus rhythm now.  She is on Coumadin.  Will admit to internal medicine.  Patient did not hit her head in the fall was mechanical.  Final Clinical Impressions(s) / ED Diagnoses   Final diagnoses:  Closed fracture of left hip, initial encounter [facility](HCC)    ED Discharge Orders    None       Benjiman CorePickering, Romelia Bromell, MD 05/22/17 2038

## 2017-05-23 ENCOUNTER — Encounter (HOSPITAL_COMMUNITY)
Admission: EM | Disposition: A | Discharge status: Skilled Nursing Facility | Payer: Self-pay | Source: Home / Self Care | Attending: Nephrology

## 2017-05-23 ENCOUNTER — Inpatient Hospital Stay (HOSPITAL_COMMUNITY): Payer: Medicare Other | Admitting: Certified Registered Nurse Anesthetist

## 2017-05-23 ENCOUNTER — Inpatient Hospital Stay (HOSPITAL_COMMUNITY): Payer: Medicare Other

## 2017-05-23 ENCOUNTER — Encounter (HOSPITAL_COMMUNITY): Payer: Self-pay | Admitting: *Deleted

## 2017-05-23 ENCOUNTER — Other Ambulatory Visit: Payer: Self-pay

## 2017-05-23 HISTORY — PX: INTRAMEDULLARY (IM) NAIL INTERTROCHANTERIC: SHX5875

## 2017-05-23 LAB — GLUCOSE, CAPILLARY
Glucose-Capillary: 97 mg/dL (ref 65–99)
Glucose-Capillary: 107 mg/dL — ABNORMAL HIGH (ref 65–99)

## 2017-05-23 LAB — PROTIME-INR
INR: 1.66
PROTHROMBIN TIME: 19.5 s — AB (ref 11.4–15.2)

## 2017-05-23 LAB — SURGICAL PCR SCREEN
MRSA, PCR: NEGATIVE
Staphylococcus aureus: NEGATIVE

## 2017-05-23 SURGERY — FIXATION, FRACTURE, INTERTROCHANTERIC, WITH INTRAMEDULLARY ROD
Anesthesia: General | Site: Hip | Laterality: Left

## 2017-05-23 MED ORDER — FENTANYL CITRATE (PF) 100 MCG/2ML IJ SOLN: 25.0000 ug | INTRAMUSCULAR | Status: DC | PRN

## 2017-05-23 MED ORDER — KCL IN DEXTROSE-NACL 20-5-0.9 MEQ/L-%-% IV SOLN
INTRAVENOUS | Status: DC
  Administered 2017-05-23: 10:00:00 via INTRAVENOUS
  Filled 2017-05-23 (×2): qty 1000

## 2017-05-23 MED ORDER — LACTATED RINGERS IV SOLN
INTRAVENOUS | Status: DC | PRN
  Administered 2017-05-23: 15:00:00 via INTRAVENOUS

## 2017-05-23 MED ORDER — 0.9 % SODIUM CHLORIDE (POUR BTL) OPTIME
TOPICAL | Status: DC | PRN
  Administered 2017-05-23: 1000 mL

## 2017-05-23 MED ORDER — METOCLOPRAMIDE HCL 5 MG/ML IJ SOLN: 5.0000 mg | Freq: Three times a day (TID) | INTRAMUSCULAR | Status: DC | PRN

## 2017-05-23 MED ORDER — CLINDAMYCIN PHOSPHATE 600 MG/50ML IV SOLN
600.0000 mg | Freq: Four times a day (QID) | INTRAVENOUS | Status: AC
  Administered 2017-05-23 – 2017-05-24 (×3): 600 mg via INTRAVENOUS
  Filled 2017-05-23 (×3): qty 50

## 2017-05-23 MED ORDER — SENNA 8.6 MG PO TABS
1.0000 | ORAL_TABLET | Freq: Two times a day (BID) | ORAL | Status: DC
  Administered 2017-05-23 – 2017-05-25 (×4): 8.6 mg via ORAL
  Filled 2017-05-23 (×4): qty 1

## 2017-05-23 MED ORDER — HYDROCODONE-ACETAMINOPHEN 5-325 MG PO TABS: 1.0000 | ORAL_TABLET | Freq: Four times a day (QID) | ORAL | 0 refills | Status: DC | PRN

## 2017-05-23 MED ORDER — ACETAMINOPHEN 325 MG PO TABS
650.0000 mg | ORAL_TABLET | ORAL | Status: DC | PRN
  Filled 2017-05-23: qty 2

## 2017-05-23 MED ORDER — ONDANSETRON HCL 4 MG/2ML IJ SOLN: 4.0000 mg | Freq: Four times a day (QID) | INTRAMUSCULAR | Status: DC | PRN

## 2017-05-23 MED ORDER — FENTANYL CITRATE (PF) 100 MCG/2ML IJ SOLN
INTRAMUSCULAR | Status: DC | PRN
Start: 2017-05-23 — End: 2017-05-23
  Administered 2017-05-23: 50 ug via INTRAVENOUS
  Administered 2017-05-23 (×2): 25 ug via INTRAVENOUS

## 2017-05-23 MED ORDER — FAMOTIDINE IN NACL 20-0.9 MG/50ML-% IV SOLN
20.0000 mg | INTRAVENOUS | Status: DC
  Administered 2017-05-23: 20 mg via INTRAVENOUS
  Filled 2017-05-23 (×3): qty 50

## 2017-05-23 MED ORDER — PROPOFOL 10 MG/ML IV BOLUS
INTRAVENOUS | Status: DC | PRN
  Administered 2017-05-23: 120 mg via INTRAVENOUS

## 2017-05-23 MED ORDER — STERILE WATER FOR IRRIGATION IR SOLN
Status: DC | PRN
  Administered 2017-05-23: 1000 mL

## 2017-05-23 MED ORDER — DEXAMETHASONE SODIUM PHOSPHATE 10 MG/ML IJ SOLN
INTRAMUSCULAR | Status: AC
  Filled 2017-05-23: qty 1

## 2017-05-23 MED ORDER — ONDANSETRON HCL 4 MG PO TABS: 4.0000 mg | ORAL_TABLET | Freq: Four times a day (QID) | ORAL | Status: DC | PRN

## 2017-05-23 MED ORDER — ACETAMINOPHEN 650 MG RE SUPP: 650.0000 mg | RECTAL | Status: DC | PRN

## 2017-05-23 MED ORDER — FENTANYL CITRATE (PF) 100 MCG/2ML IJ SOLN
INTRAMUSCULAR | Status: AC
  Filled 2017-05-23: qty 2

## 2017-05-23 MED ORDER — ONDANSETRON HCL 4 MG/2ML IJ SOLN
INTRAMUSCULAR | Status: DC | PRN
  Administered 2017-05-23: 4 mg via INTRAVENOUS

## 2017-05-23 MED ORDER — SUGAMMADEX SODIUM 200 MG/2ML IV SOLN
INTRAVENOUS | Status: DC | PRN
  Administered 2017-05-23: 131.6 mg via INTRAVENOUS

## 2017-05-23 MED ORDER — OXYCODONE HCL 5 MG/5ML PO SOLN: 5.0000 mg | Freq: Once | ORAL | Status: DC | PRN

## 2017-05-23 MED ORDER — DEXAMETHASONE SODIUM PHOSPHATE 10 MG/ML IJ SOLN
INTRAMUSCULAR | Status: DC | PRN
  Administered 2017-05-23: 8 mg via INTRAVENOUS

## 2017-05-23 MED ORDER — ROCURONIUM BROMIDE 10 MG/ML (PF) SYRINGE
PREFILLED_SYRINGE | INTRAVENOUS | Status: DC | PRN
  Administered 2017-05-23: 40 mg via INTRAVENOUS

## 2017-05-23 MED ORDER — SUGAMMADEX SODIUM 200 MG/2ML IV SOLN
INTRAVENOUS | Status: AC
  Filled 2017-05-23: qty 2

## 2017-05-23 MED ORDER — OXYCODONE HCL 5 MG PO TABS: 5.0000 mg | ORAL_TABLET | Freq: Once | ORAL | Status: DC | PRN

## 2017-05-23 MED ORDER — LIDOCAINE 2% (20 MG/ML) 5 ML SYRINGE
INTRAMUSCULAR | Status: DC | PRN
  Administered 2017-05-23: 50 mg via INTRAVENOUS

## 2017-05-23 MED ORDER — ONDANSETRON HCL 4 MG/2ML IJ SOLN
INTRAMUSCULAR | Status: AC
  Filled 2017-05-23: qty 2

## 2017-05-23 MED ORDER — METOCLOPRAMIDE HCL 5 MG PO TABS: 5.0000 mg | ORAL_TABLET | Freq: Three times a day (TID) | ORAL | Status: DC | PRN

## 2017-05-23 SURGICAL SUPPLY — 31 items
BAG SPEC THK2 15X12 ZIP CLS (MISCELLANEOUS)
BAG ZIPLOCK 12X15 (MISCELLANEOUS) IMPLANT
BIT DRILL CANN LRG QC 5X300 (BIT) ×2 IMPLANT
BNDG GAUZE ELAST 4 BULKY (GAUZE/BANDAGES/DRESSINGS) ×3 IMPLANT
COVER PERINEAL POST (MISCELLANEOUS) ×3 IMPLANT
COVER SURGICAL LIGHT HANDLE (MISCELLANEOUS) ×3 IMPLANT
DRAPE C-ARM 42X120 X-RAY (DRAPES) IMPLANT
DRAPE INCISE IOBAN 66X45 STRL (DRAPES) ×3 IMPLANT
DRAPE ORTHO SPLIT 77X108 STRL (DRAPES)
DRAPE STERI IOBAN 125X83 (DRAPES) ×3 IMPLANT
DRAPE SURG 17X11 SM STRL (DRAPES) IMPLANT
DRAPE SURG ORHT 6 SPLT 77X108 (DRAPES) IMPLANT
DRSG AQUACEL AG ADV 3.5X 4 (GAUZE/BANDAGES/DRESSINGS) ×2 IMPLANT
DURAPREP 26ML APPLICATOR (WOUND CARE) ×3 IMPLANT
ELECT REM PT RETURN 15FT ADLT (MISCELLANEOUS) ×3 IMPLANT
GAUZE SPONGE 4X4 12PLY STRL (GAUZE/BANDAGES/DRESSINGS) ×3 IMPLANT
GAUZE XEROFORM 1X8 LF (GAUZE/BANDAGES/DRESSINGS) ×3 IMPLANT
GLOVE BIO SURGEON STRL SZ7.5 (GLOVE) ×3 IMPLANT
GLOVE INDICATOR 8.0 STRL GRN (GLOVE) ×3 IMPLANT
GOWN STRL REUS W/TWL LRG LVL3 (GOWN DISPOSABLE) ×5 IMPLANT
GUIDEWIRE THREADED 2.8 (WIRE) ×8 IMPLANT
KIT BASIN OR (CUSTOM PROCEDURE TRAY) ×3 IMPLANT
MANIFOLD NEPTUNE II (INSTRUMENTS) ×3 IMPLANT
PACK GENERAL/GYN (CUSTOM PROCEDURE TRAY) ×3 IMPLANT
POSITIONER SURGICAL ARM (MISCELLANEOUS) ×3 IMPLANT
SCREW 6.5MM CANN 32X85 (Screw) ×4 IMPLANT
SCREW CANN 16 THRD/85 7.3 (Screw) ×2 IMPLANT
STAPLER VISISTAT 35W (STAPLE) ×3 IMPLANT
SUT VIC AB 2-0 CT1 27 (SUTURE) ×3
SUT VIC AB 2-0 CT1 TAPERPNT 27 (SUTURE) ×1 IMPLANT
TOWEL OR 17X26 10 PK STRL BLUE (TOWEL DISPOSABLE) ×3 IMPLANT

## 2017-05-23 NOTE — Op Note (Addendum)
Date of Surgery: 05/23/2017  INDICATIONS: [name] is a 83 y.o.-year-old female who sustained a hip fracture; she was indicated for open reduction and internal fixation.  On plain radiographs and CT scan she was found to have a valgus impacted left femoral neck fracture.  Recommendation was for percutaneous pinning to stabilize the fracture and allow for early weightbearing and ambulation. The patient did consent to the procedure after discussion of the risks and benefits.   PREOPERATIVE DIAGNOSIS: left hip fracture  POSTOPERATIVE DIAGNOSIS: Same.  PROCEDURE: Open treatment of proximal end of femur, neck with internal fixation. CPT [id number].  SURGEON: Maryan RuedJason P Nello Corro, MD  ASSIST: None,  ANESTHESIA: general  IV FLUIDS AND URINE: See anesthesia.  ESTIMATED BLOOD LOSS: 5 mL.  IMPLANTS:  Synthes cannulated screws 6.5 mm x 85 mm with 32 mm threads x2 7.3 mm x 85 mm with 32 mm threads x1  DRAINS: None.  COMPLICATIONS: None.  DESCRIPTION OF PROCEDURE: The patient was brought to the operating room and placed supine on the operating table.  The patient had been signed prior to the procedure and this was documented. The patient had the anesthesia placed by the anesthesiologist.  The prep verification and incision time-outs were performed to confirm that this was the correct patient, site, side and location. The patient had SCDs in place. The patient did receive antibiotics prior to the incision and was redosed during the procedure as needed at indicated intervals.  The patient's well leg was placed in a flexed lithotomy position and carefully padded.  The operative leg was placed in traction and also well-padded.  Care was taken to confirm radiographs before prepping and draping. The hip was prepped and draped in the standard fashion.  Screws were placed using the following technique.  The 2.8 mm guide pin was first placed and confirmed to be in the proper location on both AP and lateral views.  After the guidewire was placed, the skin incision was made over the guidewire, then the 4.5 mm cannulated drill was started over the wire.  The drill was used to drill the bony corridor, again confirming during drilling on both views. The final cannulated screw guidewire was then placed and again confirmed in position on both views.  The measuring stick was used to measure the length of screws.  This was repeated for all screws.  The approach-withdraw phenomenon was visualized under live fluoroscopy to confirm that all screws were of the appropriate length and in the head.   Of note, during the orthopedic procedure, the bone quality was found to be very poor. All wounds were copiously irrigated with saline and then the skin was reapproximated with 2-0 Vicryl for subcutaneous tissue, and staples for the skin. The wounds were cleaned and dried a final time and a sterile dressing. The patient was then transferred back to the bed and left the operating room in stable condition.  All sponge and instrument counts were correct.  There were no immediate intraoperative complications.  POSTOPERATIVE PLAN: [name] will be weight bearing as tolerated; she will return for staple removal in 2 weeks. [name] will receive DVT prophylaxis based on other medications, activity level, and risk ratio of bleeding to thrombosis per the primary team; she is appropriate to resume her Coumadin on postoperative day #1.  [name] will likely need a short period of rehabilitation in an inpatient rehab setting postoperatively.  She does live alone.

## 2017-05-23 NOTE — Discharge Instructions (Signed)
Orthopedic discharge instructions:  Okay for full weightbearing as tolerated to the left lower extremity.-  -Maintain postoperative bandage until your follow-up appointment.  It is okay to shower with this bandage in place, but do not submerge underwater. -Return to see Dr. Aundria Rudogers in 2-3 weeks for staple removal.

## 2017-05-23 NOTE — Progress Notes (Deleted)
[facility].  Will see patient in am.  Pt. Prepped for discharge. IV d/c'ed.  Dressing to go home.

## 2017-05-23 NOTE — Transfer of Care (Signed)
Immediate Anesthesia Transfer of Care Note  Patient: [name] [name]  Procedure(s) Performed: Percutaneous Pinning of the left hip (Left Hip)  Patient Location: PACU  Anesthesia Type:General  Level of Consciousness: awake, alert  and oriented  Airway & Oxygen Therapy: Patient Spontanous Breathing and Patient connected to face mask oxygen  Post-op Assessment: Report given to RN  Post vital signs: Reviewed and stable  Last Vitals:  Vitals:   05/23/17 0537 05/23/17 1426  BP: (!) 133/59 (!) 163/66  Pulse: 60 60  Resp: 16 16  Temp: 37.1 C 36.7 C  SpO2: 95% 99%    Last Pain:  Vitals:   05/23/17 1426  TempSrc: Oral  PainSc:       Patients Stated Pain Goal: 3 (05/22/17 2344)  Complications: No apparent anesthesia complications

## 2017-05-23 NOTE — H&P (Signed)
H&P update  The surgical history has been reviewed and remains accurate without interval change.  The patient was re-examined and patient's physiologic condition has not changed significantly in the last 30 days. The condition still exists that makes this procedure necessary. The treatment plan remains the same, without new options for care.  No new pharmacological allergies or types of therapy has been initiated that would change the plan or the appropriateness of the plan.  The patient and/or family understand the potential benefits and risks.  Jason P. Aundria Rudogers, MD 05/23/2017 3:37 PM

## 2017-05-23 NOTE — Brief Op Note (Signed)
05/23/2017  5:03 PM  PATIENT:  [name] FlowBetty F [name]  83 y.o. female  PRE-OPERATIVE DIAGNOSIS:  Left Hip Fracture  POST-OPERATIVE DIAGNOSIS:  Left Hip Fracture  PROCEDURE:  Procedure(s): Percutaneous Pinning of the left hip (Left)  SURGEON:  Surgeon(s) and Role:    * Yolonda Kidaogers, Adnan Vanvoorhis Patrick, MD - Primary  PHYSICIAN ASSISTANT:   ASSISTANTS: none   ANESTHESIA:   general  EBL:  50 mL   BLOOD ADMINISTERED:none  DRAINS: none   LOCAL MEDICATIONS USED:  NONE  SPECIMEN:  No Specimen  DISPOSITION OF SPECIMEN:  N/A  COUNTS:  YES  TOURNIQUET:  * No tourniquets in log *  DICTATION: .Note written in EPIC  PLAN OF CARE: Admit to inpatient   PATIENT DISPOSITION:  PACU - hemodynamically stable.   Delay start of Pharmacological VTE agent (>24hrs) due to surgical blood loss or risk of bleeding: not applicable

## 2017-05-23 NOTE — Progress Notes (Signed)
Nutrition Brief Note  Consult per Hip Fracture Protocol.   Wt Readings from Last 15 Encounters:  05/22/17 145 lb (65.8 kg)  03/30/17 147 lb 3.2 oz (66.8 kg)  12/28/16 148 lb (67.1 kg)  12/11/16 150 lb (68 kg)  09/25/16 149 lb 12.8 oz (67.9 kg)  03/29/16 148 lb (67.1 kg)  09/21/15 149 lb (67.6 kg)  03/15/15 148 lb (67.1 kg)  09/16/14 149 lb (67.6 kg)  03/10/14 146 lb 12.8 oz (66.6 kg)  09/18/13 150 lb (68 kg)  03/17/13 146 lb (66.2 kg)  08/23/12 151 lb 1.9 oz (68.5 kg)  11/14/11 144 lb 12.8 oz (65.7 kg)  05/15/11 147 lb (66.7 kg)    Body mass index is 26.52 kg/m. Patient meets criteria for overweight (appropriate for age) based on current BMI. Pt experienced a mechanical fall at home and was admitted for L hip fx; pending pinning today.   Current diet order is NPO.  Medications reviewed; 100 mg Colace/day, daily multivitamin with minerals, 1 packet Metamucil/day.  Labs reviewed; BUN: 29 m/dL. IVF: D5-NS-20 mEq KCl @ 75 mL/hr (306 kcal).   No nutrition interventions warranted at this time. If nutrition issues arise, please consult RD.      Trenton GammonJessica Najee Manninen, MS, RD, LDN, [facility] Inpatient Clinical Dietitian Pager # [phone] After hours/weekend pager # [phone]

## 2017-05-23 NOTE — Anesthesia Procedure Notes (Signed)
Procedure Name: Intubation Date/Time: 05/23/2017 4:13 PM Performed by: Lavina Hamman, CRNA Pre-anesthesia Checklist: Patient identified, Emergency Drugs available, Suction available, Patient being monitored and Timeout performed Patient Re-evaluated:Patient Re-evaluated prior to induction Oxygen Delivery Method: Circle system utilized Preoxygenation: Pre-oxygenation with 100% oxygen Induction Type: IV induction Ventilation: Mask ventilation without difficulty Laryngoscope Size: Mac and 4 Grade View: Grade I Tube type: Oral Tube size: 7.5 mm Number of attempts: 1 Airway Equipment and Method: Stylet Placement Confirmation: ETT inserted through vocal cords under direct vision,  positive ETCO2,  CO2 detector and breath sounds checked- equal and bilateral Secured at: 21 cm Tube secured with: Tape Dental Injury: Teeth and Oropharynx as per pre-operative assessment

## 2017-05-23 NOTE — Progress Notes (Signed)
PROGRESS NOTE    [name] [name]  BJY:[id number] DOB: [birth year] DOA: 05/22/2017 PCP: Juliette Alcide, MD   Brief Narrative: 83 year old female with history of paroxysmal atrial fibrillation on Coumadin presented after a fall at home sustaining severe left hip pain.  Patient was found to have left hip fracture.  Orthopedics was consulted and admitted for further evaluation.  Plan for surgery today.  Assessment & Plan:   Principal Problem:   Fracture of femoral neck, left, closed (HCC) Active Problems:   Atrial fibrillation (HCC)   Chronic anticoagulation  -Repeat INR 1.6 today.  Patient does not have chest pain, shortness of breath.  No history of congestive heart failure.  She has good functional status and before this fall.  EKG sinus rhythm unchanged from before.  I think patient is at intermittent cardiac risk for moderate risk surgery.  No cardiac workup before the procedure. -Start IV fluid with dextrose and electrolyte while n.p.o. -Start Pepcid -Continue hydrocodone and morphine for the pain management. -Hold Coumadin, continue metoprolol.  Monitor heart rate and labs.  I have discussed with the patient and she verbalized understanding.  She will need PT OT evaluation and possibly rehab on discharge.  DVT prophylaxis: Plan for surgery today, SCD and anticoagulation defer to surgeon Code Status: Full code Family Communication: No family at bedside Disposition Plan: Likely discharge to rehab in 1-2 days    Consultants:   Orthopedics  Procedures: None Antimicrobials: Discontinue doxycycline because of no clear indication.  Subjective: Seen and examined at bedside.  Reported some discomfort at the site of fracture.  Denied nausea vomiting chest pain shortness of breath.  Objective: Vitals:   05/22/17 1903 05/22/17 2101 05/22/17 2253 05/23/17 0537  BP: (!) 177/75 (!) 170/68 (!) 161/61 (!) 133/59  Pulse: 74 73 66 60  Resp: 18 19 18 16   Temp:   99.1 F (37.3 C)  98.7 F (37.1 C)  TempSrc:   Oral Oral  SpO2: 97% 94% 95% 95%  Weight:      Height:        Intake/Output Summary (Last 24 hours) at 05/23/2017 1205 Last data filed at 05/23/2017 0539 Gross per 24 hour  Intake 50 ml  Output 200 ml  Net -150 ml   Filed Weights   05/22/17 1652  Weight: 65.8 kg (145 lb)    Examination:  General exam: Appears calm and comfortable  Respiratory system: Clear to auscultation. Respiratory effort normal. No wheezing or crackle Cardiovascular system: S1 & S2 heard, RRR.  Gastrointestinal system: Abdomen is nondistended, soft and nontender. Normal bowel sounds heard. Central nervous system: Alert and oriented. No focal neurological deficits. Extremities:  no edema. Skin: No rashes, lesions or ulcers Psychiatry: Judgement and insight appear normal. Mood & affect appropriate.     Data Reviewed: I have personally reviewed following labs and imaging studies  CBC: Recent Labs  Lab 05/22/17 1942  WBC 10.5  NEUTROABS 8.4*  HGB 13.9  HCT 41.5  MCV 96.5  PLT 172   Basic Metabolic Panel: Recent Labs  Lab 05/22/17 1942  NA 138  K 3.8  CL 103  CO2 27  GLUCOSE 142*  BUN 29*  CREATININE 0.85  CALCIUM 9.0   GFR: Estimated Creatinine Clearance: 41.5 mL/min (by C-G formula based on SCr of 0.85 mg/dL). Liver Function Tests: No results for input(s): AST, ALT, ALKPHOS, BILITOT, PROT, ALBUMIN in the last 168 hours. No results for input(s): LIPASE, AMYLASE in the last 168 hours. No results for input(s):  AMMONIA in the last 168 hours. Coagulation Profile: Recent Labs  Lab 05/22/17 1942 05/23/17 0842  INR 2.05 1.66   Cardiac Enzymes: No results for input(s): CKTOTAL, CKMB, CKMBINDEX, TROPONINI in the last 168 hours. BNP (last 3 results) No results for input(s): PROBNP in the last 8760 hours. HbA1C: No results for input(s): HGBA1C in the last 72 hours. CBG: No results for input(s): GLUCAP in the last 168 hours. Lipid Profile: No results  for input(s): CHOL, HDL, LDLCALC, TRIG, CHOLHDL, LDLDIRECT in the last 72 hours. Thyroid Function Tests: No results for input(s): TSH, T4TOTAL, FREET4, T3FREE, THYROIDAB in the last 72 hours. Anemia Panel: No results for input(s): VITAMINB12, FOLATE, FERRITIN, TIBC, IRON, RETICCTPCT in the last 72 hours. Sepsis Labs: No results for input(s): PROCALCITON, LATICACIDVEN in the last 168 hours.  Recent Results (from the past 240 hour(s))  Surgical pcr screen     Status: None   Collection Time: 05/22/17 11:56 PM  Result Value Ref Range Status   MRSA, PCR NEGATIVE NEGATIVE Final   Staphylococcus aureus NEGATIVE NEGATIVE Final    Comment: (NOTE) The Xpert SA Assay (FDA approved for NASAL specimens in patients 81 years of age and older), is one component of a comprehensive surveillance program. It is not intended to diagnose infection nor to guide or monitor treatment.          Radiology Studies: Ct Hip Left Wo Contrast  Result Date: 05/22/2017 CLINICAL DATA:  83 year old female with left hip fracture. EXAM: CT OF THE LEFT HIP WITHOUT CONTRAST TECHNIQUE: Multidetector CT imaging of the left hip was performed according to the standard protocol. Multiplanar CT image reconstructions were also generated. COMPARISON:  Left hip radiograph dated 05/22/2017 FINDINGS: Bones/Joint/Cartilage Evaluation for fracture is limited due to advanced osteopenia. There is a nondisplaced fracture of the left femoral neck. There is no dislocation. Ligaments Suboptimally assessed by CT. Muscles and Tendons No acute findings.  No intramuscular hematoma. Soft tissues Sigmoid diverticulosis. The soft tissues are otherwise unremarkable. IMPRESSION: Nondisplaced left femoral neck fracture. Electronically Signed   By: Elgie CollardArash  Radparvar M.D.   On: 05/22/2017 20:56   Dg Chest Port 1 View  Result Date: 05/22/2017 CLINICAL DATA:  Status post fall, left hip fracture EXAM: PORTABLE CHEST 1 VIEW COMPARISON:  None. FINDINGS:  There is no focal parenchymal opacity. There is no pleural effusion or pneumothorax. There is stable cardiomegaly. There is thoracic aortic atherosclerosis. The osseous structures are unremarkable. IMPRESSION: No active disease. Electronically Signed   By: Elige KoHetal  Patel   On: 05/22/2017 19:57   Dg Hip Unilat W Or Wo Pelvis 2-3 Views Left  Result Date: 05/22/2017 CLINICAL DATA:  Status post fall, pain EXAM: DG HIP (WITH OR WITHOUT PELVIS) 2-3V LEFT COMPARISON:  None. FINDINGS: Nondisplaced left subcapital femoral neck fracture. No other fracture or dislocation. Generalized osteopenia. Prior ORIF of a right femoral neck fracture. Lower lumbar spine spondylosis. IMPRESSION: Nondisplaced left subcapital femoral neck fracture. Electronically Signed   By: Elige KoHetal  Patel   On: 05/22/2017 18:56        Scheduled Meds: . calcium-vitamin D  1 tablet Oral Daily  . chlorhexidine  60 mL Topical Once  . docusate sodium  100 mg Oral QHS  . doxycycline  100 mg Oral Daily  . flecainide  50 mg Oral BID  . metoprolol succinate  25 mg Oral Daily  . metroNIDAZOLE  1 application Topical Daily  . multivitamin with minerals  1 tablet Oral Daily  . povidone-iodine  2 application  Topical Once  . psyllium   Oral Daily   Continuous Infusions: . clindamycin (CLEOCIN) IV    . dextrose 5 % and 0.9 % NaCl with KCl 20 mEq/L 75 mL/hr at 05/23/17 0942  . famotidine (PEPCID) IV       LOS: 1 day    Jamichael Knotts Jaynie CollinsPrasad Zahki Hoogendoorn, MD [facility] Pager [phone]  If 7PM-7AM, please contact night-coverage [email or web address] Password [email or web address] 05/23/2017, 12:05 PM

## 2017-05-24 ENCOUNTER — Encounter (HOSPITAL_COMMUNITY): Payer: Self-pay | Admitting: Orthopedic Surgery

## 2017-05-24 LAB — CBC
HEMATOCRIT: 35.7 % — AB (ref 36.0–46.0)
Hemoglobin: 12 g/dL (ref 12.0–15.0)
MCH: 32.1 pg (ref 26.0–34.0)
MCHC: 33.6 g/dL (ref 30.0–36.0)
MCV: 95.5 fL (ref 78.0–100.0)
PLATELETS: 135 10*3/uL — AB (ref 150–400)
RBC: 3.74 MIL/uL — AB (ref 3.87–5.11)
RDW: 14.1 % (ref 11.5–15.5)
WBC: 9.7 10*3/uL (ref 4.0–10.5)

## 2017-05-24 LAB — BASIC METABOLIC PANEL
Anion gap: 4 — ABNORMAL LOW (ref 5–15)
BUN: 16 mg/dL (ref 6–20)
CO2: 26 mmol/L (ref 22–32)
Calcium: 8.3 mg/dL — ABNORMAL LOW (ref 8.9–10.3)
Chloride: 108 mmol/L (ref 101–111)
Creatinine, Ser: 0.71 mg/dL (ref 0.44–1.00)
GFR calc Af Amer: >60 mL/min (ref >60)
GLUCOSE: 154 mg/dL — AB (ref 65–99)
POTASSIUM: 3.8 mmol/L (ref 3.5–5.1)
Sodium: 138 mmol/L (ref 135–145)

## 2017-05-24 LAB — PROTIME-INR
INR: 1.26
PROTHROMBIN TIME: 15.7 s — AB (ref 11.4–15.2)

## 2017-05-24 MED ORDER — WARFARIN - PHARMACIST DOSING INPATIENT: Freq: Every day | Status: DC

## 2017-05-24 MED ORDER — WARFARIN SODIUM 6 MG PO TABS
6.0000 mg | ORAL_TABLET | Freq: Once | ORAL | Status: AC
  Administered 2017-05-24: 6 mg via ORAL
  Filled 2017-05-24: qty 1

## 2017-05-24 NOTE — Progress Notes (Signed)
ANTICOAGULATION CONSULT NOTE - Initial Consult  Pharmacy Consult for Warfarin Indication: atrial fibrillation  Allergies  Allergen Reactions  . Penicillins Hives  . Cephalexin Nausea And Vomiting    REACTION: nausea REACTION: nausea  . Digoxin     REACTION: rash on legs  . Latex     Patient Measurements: Height: 5\' 2"  (157.5 cm) Weight: 145 lb (65.8 kg) IBW/kg (Calculated) : 50.1  Vital Signs: Temp: 98.2 F (36.8 C) (12/27 0601) Temp Source: Oral (12/27 0601) BP: 145/65 (12/27 0601) Pulse Rate: 59 (12/27 0601)  Labs: Recent Labs    05/22/17 1942 05/23/17 0842 05/24/17 0454  HGB 13.9  --  12.0  HCT 41.5  --  35.7*  PLT 172  --  135*  LABPROT 23.0* 19.5*  --   INR 2.05 1.66  --   CREATININE 0.85  --  0.71    Estimated Creatinine Clearance: 44.1 mL/min (by C-G formula based on SCr of 0.71 mg/dL).   Medical History: Past Medical History:  Diagnosis Date  . Chronic edema   . History of cardiovascular stress test    Negative dobutamine echo 6/13  . Osteoarthritis   . Paroxysmal atrial fibrillation (HCC)   . PFO (patent foramen ovale)   . Rosacea   . Type 2 diabetes mellitus (HCC)     Assessment: [age] yoF admitted after a fall with hip fracture.  S/p ORIF on 12/26.  PMH includes Afib on chronic warfarin anticoagulation which was held on 12/26 and reversed with Vit K 1mg  IV on 12/25 PM.  Pharmacy is now consulted to resume warfarin dosing.  Home warfarin dose 5 mg daily except 2.5 mg on Monday and Fridays.  Last dose on 12/24 at 1700. Admission INR 2.05  Today, 05/24/2017: INR pending CBC: Hgb decreased to 12, Plt decreased to 135k No postop bleeding or complications reported. Diet: regular Drug-drug interactions: Vit K given 12/25 may suppress INR   Goal of Therapy:  INR 2-3 Monitor platelets by anticoagulation protocol: Yes   Plan:  Warfarin 6 mg PO x 1 dose now Daily PT/INR. Monitor for signs and symptoms of bleeding.  Lynann Beaverhristine Shakema Surita PharmD,  BCPS Pager [phone] 05/24/2017 1:18 PM

## 2017-05-24 NOTE — Progress Notes (Signed)
Physical Therapy Treatment Patient Details Name: [name] [name] MRN: [id number] DOB: [birth year] Today's Date: 05/24/2017    History of Present Illness Pt admitted after a fall in which she sustained a L hip fx. Underwent ORIF 12/26. PMH: afib, chronic back pain, multiple R knee sx, arthritis in L knee.    PT Comments    Pt continues motivated and progressing steadily with mobility.   Follow Up Recommendations  SNF;Home health PT     Equipment Recommendations  Rolling walker with 5" wheels    Recommendations for Other Services       Precautions / Restrictions Precautions Precautions: Fall Restrictions Weight Bearing Restrictions: No LLE Weight Bearing: Weight bearing as tolerated    Mobility  Bed Mobility Overal bed mobility: Needs Assistance Bed Mobility: Supine to Sit     Supine to sit: Min assist;HOB elevated     General bed mobility comments: assist for L LE, verbal cues for technique  Transfers Overall transfer level: Needs assistance Equipment used: Rolling walker (2 wheeled) Transfers: Sit to/from Stand Sit to Stand: Min assist         General transfer comment: verbal cues for hand placement, assist to rise and steady  Ambulation/Gait Ambulation/Gait assistance: Min assist Ambulation Distance (Feet): 48 Feet Assistive device: Rolling walker (2 wheeled) Gait Pattern/deviations: Step-to pattern;Step-through pattern;Decreased step length - right;Decreased step length - left;Shuffle;Trunk flexed Gait velocity: decr Gait velocity interpretation: Below normal speed for age/gender General Gait Details: cues for sequence, posture and position from RW.  Physical assist for balance/support and RW management   Stairs            Wheelchair Mobility    Modified Rankin (Stroke Patients Only)       Balance Overall balance assessment: Needs assistance Sitting-balance support: Feet supported Sitting balance-Leahy Scale: Good     Standing  balance support: Bilateral upper extremity supported Standing balance-Leahy Scale: Fair                              Cognition Arousal/Alertness: Awake/alert Behavior During Therapy: WFL for tasks assessed/performed Overall Cognitive Status: Within Functional Limits for tasks assessed                                        Exercises General Exercises - Lower Extremity Ankle Circles/Pumps: AROM;Both;15 reps;Supine Quad Sets: AROM;Both;10 reps;Supine Heel Slides: AAROM;Left;20 reps;Supine Hip ABduction/ADduction: AAROM;Left;15 reps;Supine    General Comments        Pertinent Vitals/Pain Pain Assessment: 0-10 Pain Score: 5  Pain Location: L hip Pain Descriptors / Indicators: Operative site guarding Pain Intervention(s): Limited activity within patient's tolerance;Monitored during session;Premedicated before session;Ice applied    Home Living                      Prior Function            PT Goals (current goals can now be found in the care plan section) Acute Rehab PT Goals Patient Stated Goal: to return to PLOF PT Goal Formulation: With patient Time For Goal Achievement: 05/31/17 Potential to Achieve Goals: Good Progress towards PT goals: Progressing toward goals    Frequency    Min 5X/week      PT Plan Current plan remains appropriate    Co-evaluation  AM-PAC PT "6 Clicks" Daily Activity  Outcome Measure  Difficulty turning over in bed (including adjusting bedclothes, sheets and blankets)?: Unable Difficulty moving from lying on back to sitting on the side of the bed? : Unable Difficulty sitting down on and standing up from a chair with arms (e.g., wheelchair, bedside commode, etc,.)?: Unable Help needed moving to and from a bed to chair (including a wheelchair)?: A Little Help needed walking in hospital room?: A Little Help needed climbing 3-5 steps with a railing? : Total 6 Click Score: 10     End of Session Equipment Utilized During Treatment: Gait belt Activity Tolerance: Patient tolerated treatment well;Patient limited by fatigue Patient left: in chair;with call bell/phone within reach;with family/visitor present Nurse Communication: Mobility status PT Visit Diagnosis: Unsteadiness on feet (R26.81);History of falling (Z91.81);Difficulty in walking, not elsewhere classified (R26.2)     Time: [id number] PT Time Calculation (min) (ACUTE ONLY): 26 min  Charges:  $Gait Training: 8-22 mins $Therapeutic Exercise: 8-22 mins                    G Codes:       Pg [phone]    [name] [name] 05/24/2017, 5:17 PM

## 2017-05-24 NOTE — Clinical Social Work Note (Addendum)
Clinical Social Work Assessment  Patient Details  Name: [name] [name] MRN: [id number] Date of Birth: 09/13/1927  Date of referral:  05/24/17               Reason for consult:  Facility Placement                Permission sought to share information with:  Facility Contact Representative Permission granted to share information::  Yes, Verbal Permission Granted  Name::        Agency::     Relationship::     Contact Information:     Housing/Transportation Living arrangements for the past 2 months:  Single Family Home Source of Information:  Patient Patient Interpreter Needed:  None Criminal Activity/Legal Involvement Pertinent to Current Situation/Hospitalization:  No - Comment as needed Significant Relationships:  Adult Children Lives with:  Self Do you feel safe going back to the place where you live?  Yes Need for family participation in patient care:  Yes (Patient dependent with mobility)  Care giving concerns:  Patient admitted after having a fall at home. The patient presented with severe left hip pain. Patient dependent with mobility and will need short rehab before returning home.   Social Worker assessment / plan:  CSW met with the patient and her daughter at beside explain role and reason for visit-to assist with discharge to SNF.  Patient reports about six years ago she went to rehab for the right hip. She reports she progressed well and was able to return home. She reports she went to [facility] (aka [facility])  and would prefer to return if space is available.   CSW confirmed w/ [facility] can accept patient once medically stable.  PASRR complete. FL2 Done.   Employment status:  Retired Insurance information:  Medicare PT Recommendations:  Skilled Nursing Facility Information / Referral to community resources:  Skilled Nursing Facility  Patient/Family's Response to care:  Patient lives alone and understands she will not be able to manage at home  alone. The patient reports she will be close to her sister in [place], [place], if she is able to D/C [facility].  Patient/Family's Understanding of and Emotional Response to Diagnosis, Current Treatment, and Prognosis:  Patient very knowledgeable of her diagnosis and follow up care. Patient adult children at bedside, and helping to assist with her transition to SNF rehab.   Emotional Assessment Appearance:  Developmentally appropriate Attitude/Demeanor/Rapport:    Affect (typically observed):  Accepting, Pleasant, Calm Orientation:  Oriented to Self, Oriented to Place, Oriented to  Time, Oriented to Situation Alcohol / Substance use:    Psych involvement (Current and /or in the community):  No (Comment)  Discharge Needs  Concerns to be addressed:  Discharge Planning Concerns Readmission within the last 30 days:    Current discharge risk:  Dependent with Mobility Barriers to Discharge:  Continued Medical Work up   Nicole A Sinclair, LCSW 05/24/2017, 2:35 PM  

## 2017-05-24 NOTE — Evaluation (Signed)
Physical Therapy Evaluation Patient Details Name: Mateo FlowBetty F Duplantis MRN: [id number] DOB: August 18, 1927 Today's Date: 05/24/2017   History of Present Illness  Pt admitted after a fall in which she sustained a L hip fx. Underwent ORIF 12/26. PMH: afib, chronic back pain, multiple R knee sx, arthritis in L knee.  Clinical Impression  Pt admitted as above and presenting with functional mobility limitations 2* decreased L LE strength/ROM and post op pain.  Pt is very independent but understands follow up at SNF level rehab is appropriate unless she can arrange 24/7 assist at home.    Follow Up Recommendations SNF;Home health PT(Dependent if family can arrange 24/7 assist at home)    Equipment Recommendations  Rolling walker with 5" wheels    Recommendations for Other Services       Precautions / Restrictions Precautions Precautions: Fall Restrictions Weight Bearing Restrictions: No LLE Weight Bearing: Weight bearing as tolerated      Mobility  Bed Mobility Overal bed mobility: Needs Assistance Bed Mobility: Supine to Sit     Supine to sit: Min assist;HOB elevated     General bed mobility comments: assist for L LE, verbal cues for technique  Transfers Overall transfer level: Needs assistance Equipment used: Rolling walker (2 wheeled) Transfers: Sit to/from Stand Sit to Stand: Min assist;Mod assist         General transfer comment: verbal cues for hand placement, assist to rise and steady  Ambulation/Gait Ambulation/Gait assistance: Min assist Ambulation Distance (Feet): 37 Feet Assistive device: Rolling walker (2 wheeled) Gait Pattern/deviations: Step-to pattern;Step-through pattern;Decreased step length - right;Decreased step length - left;Shuffle;Trunk flexed Gait velocity: decr Gait velocity interpretation: Below normal speed for age/gender General Gait Details: cues for sequence, posture and position from RW.  Physical assist for balance/support and RW  management  Stairs            Wheelchair Mobility    Modified Rankin (Stroke Patients Only)       Balance Overall balance assessment: Needs assistance Sitting-balance support: Feet supported Sitting balance-Leahy Scale: Good     Standing balance support: Bilateral upper extremity supported Standing balance-Leahy Scale: Poor                               Pertinent Vitals/Pain Pain Assessment: Faces Faces Pain Scale: Hurts little more Pain Location: L hip Pain Descriptors / Indicators: Operative site guarding Pain Intervention(s): Limited activity within patient's tolerance;Monitored during session;Premedicated before session;Ice applied    Home Living Family/patient expects to be discharged to:: Private residence Living Arrangements: Alone Available Help at Discharge: Family;Available 24 hours/day(only until this Sunday) Type of Home: House Home Access: Stairs to enter Entrance Stairs-Rails: Right Entrance Stairs-Number of Steps: 4 Home Layout: One level Home Equipment: Grab bars - tub/shower;Shower seat;Walker - 2 wheels;Cane - single point      Prior Function Level of Independence: Independent with assistive device(s)         Comments: walks with a cane, drives     Hand Dominance   Dominant Hand: Right    Extremity/Trunk Assessment   Upper Extremity Assessment Upper Extremity Assessment: Overall WFL for tasks assessed    Lower Extremity Assessment Lower Extremity Assessment: LLE deficits/detail    Cervical / Trunk Assessment Cervical / Trunk Assessment: Kyphotic;Other exceptions Cervical / Trunk Exceptions: chronic back pain  Communication   Communication: No difficulties  Cognition Arousal/Alertness: Awake/alert Behavior During Therapy: WFL for tasks assessed/performed Overall Cognitive Status: Within  Functional Limits for tasks assessed                                        General Comments       Exercises     Assessment/Plan    PT Assessment Patient needs continued PT services  PT Problem List Decreased strength;Decreased range of motion;Decreased activity tolerance;Decreased mobility;Decreased balance;Decreased knowledge of use of DME;Pain       PT Treatment Interventions DME instruction;Gait training;Stair training;Functional mobility training;Therapeutic activities;Therapeutic exercise;Patient/family education    PT Goals (Current goals can be found in the Care Plan section)  Acute Rehab PT Goals Patient Stated Goal: to return to PLOF PT Goal Formulation: With patient Time For Goal Achievement: 05/31/17 Potential to Achieve Goals: Good    Frequency Min 5X/week   Barriers to discharge        Co-evaluation PT/OT/SLP Co-Evaluation/Treatment: Yes Reason for Co-Treatment: For patient/therapist safety PT goals addressed during session: Mobility/safety with mobility OT goals addressed during session: ADL's and self-care       AM-PAC PT "6 Clicks" Daily Activity  Outcome Measure Difficulty turning over in bed (including adjusting bedclothes, sheets and blankets)?: Unable Difficulty moving from lying on back to sitting on the side of the bed? : Unable Difficulty sitting down on and standing up from a chair with arms (e.g., wheelchair, bedside commode, etc,.)?: Unable Help needed moving to and from a bed to chair (including a wheelchair)?: A Little Help needed walking in hospital room?: A Little Help needed climbing 3-5 steps with a railing? : Total 6 Click Score: 10    End of Session Equipment Utilized During Treatment: Gait belt Activity Tolerance: Patient tolerated treatment well;Patient limited by fatigue Patient left: in chair;with call bell/phone within reach;with family/visitor present Nurse Communication: Mobility status PT Visit Diagnosis: Unsteadiness on feet (R26.81);History of falling (Z91.81);Difficulty in walking, not elsewhere classified (R26.2)     Time: [id number] PT Time Calculation (min) (ACUTE ONLY): 40 min   Charges:   PT Evaluation $PT Eval Low Complexity: 1 Low PT Treatments $Gait Training: 8-22 mins   PT G Codes:        Pg [phone]   Zeno Hickel 05/24/2017, 12:35 PM

## 2017-05-24 NOTE — Evaluation (Signed)
Occupational Therapy Evaluation Patient Details Name: [name] FlowBetty F Monreal MRN: [id number] DOB: Apr 26, 1928 Today's Date: 05/24/2017    History of Present Illness Pt admitted after a fall in which she sustained a L hip fx. Underwent ORIF 12/26. PMH: afib, chronic back pain, multiple R knee sx, arthritis in L knee.   Clinical Impression   Pt is fiercely independent at baseline. She walks with a cane and lives alone. Pt presents with L hip post operative pain and impaired standing balance, but mobilized remarkably well POD 1. Pt requires min to mod assist for mobility and set up to moderate assist for ADL. Pt prefers to go home upon discharge and will attempt to secure 24 hour care. Will follow acutely.    Follow Up Recommendations  SNF;Supervision/Assistance - 24 hour(HHOT if pt can coordinate 24 hour care at home)    Equipment Recommendations  3 in 1 bedside commode    Recommendations for Other Services       Precautions / Restrictions Precautions Precautions: Fall Restrictions Weight Bearing Restrictions: No LLE Weight Bearing: Weight bearing as tolerated      Mobility Bed Mobility Overal bed mobility: Needs Assistance Bed Mobility: Supine to Sit     Supine to sit: Min assist;HOB elevated     General bed mobility comments: assist for L LE, verbal cues for technique  Transfers Overall transfer level: Needs assistance Equipment used: Rolling walker (2 wheeled) Transfers: Sit to/from Stand Sit to Stand: Mod assist;Min assist         General transfer comment: verbal cues for hand placement, assist to rise and steady    Balance                                           ADL either performed or assessed with clinical judgement   ADL Overall ADL's : Needs assistance/impaired Eating/Feeding: Independent;Sitting   Grooming: Brushing hair;Set up;Sitting   Upper Body Bathing: Set up;Sitting   Lower Body Bathing: Moderate assistance;Sit to/from  stand   Upper Body Dressing : Set up;Sitting   Lower Body Dressing: Moderate assistance;Sit to/from stand   Toilet Transfer: Minimal assistance;RW;Ambulation;BSC   Toileting- Clothing Manipulation and Hygiene: Minimal assistance;Sit to/from stand       Functional mobility during ADLs: Minimal assistance;Cueing for sequencing;Rolling walker       Vision Baseline Vision/History: Wears glasses Wears Glasses: Reading only Patient Visual Report: No change from baseline       Perception     Praxis      Pertinent Vitals/Pain Pain Assessment: Faces Faces Pain Scale: Hurts little more Pain Location: L hip Pain Descriptors / Indicators: Operative site guarding Pain Intervention(s): Monitored during session;Repositioned;Ice applied     Hand Dominance Right   Extremity/Trunk Assessment Upper Extremity Assessment Upper Extremity Assessment: Overall WFL for tasks assessed(arthritic changes in hands)   Lower Extremity Assessment Lower Extremity Assessment: Defer to PT evaluation   Cervical / Trunk Assessment Cervical / Trunk Assessment: Kyphotic;Other exceptions Cervical / Trunk Exceptions: chronic back pain   Communication Communication Communication: No difficulties   Cognition Arousal/Alertness: Awake/alert Behavior During Therapy: WFL for tasks assessed/performed Overall Cognitive Status: Within Functional Limits for tasks assessed                                     General Comments  Exercises     Shoulder Instructions      Home Living Family/patient expects to be discharged to:: Private residence Living Arrangements: Alone Available Help at Discharge: Family;Available 24 hours/day(until Sunday (son)) Type of Home: House Home Access: Stairs to enter Entrance Stairs-Number of Steps: 4 Entrance Stairs-Rails: Right Home Layout: One level     Bathroom Shower/Tub: Tub/shower unit   Bathroom Toilet: Handicapped height     Home  Equipment: Grab bars - tub/shower;Shower seat;Walker - 2 wheels;Cane - single point          Prior Functioning/Environment Level of Independence: Independent with assistive device(s)        Comments: walks with a cane, drives        OT Problem List: Impaired balance (sitting and/or standing);Decreased knowledge of use of DME or AE;Pain      OT Treatment/Interventions: Self-care/ADL training;DME and/or AE instruction;Patient/family education;Balance training;Therapeutic activities    OT Goals(Current goals can be found in the care plan section) Acute Rehab OT Goals Patient Stated Goal: to return to PLOF OT Goal Formulation: With patient Time For Goal Achievement: 06/07/17 Potential to Achieve Goals: Good ADL Goals Pt Will Perform Grooming: with supervision;standing Pt Will Perform Lower Body Bathing: with supervision;with adaptive equipment;sit to/from stand Pt Will Perform Lower Body Dressing: with supervision;with adaptive equipment;sit to/from stand Pt Will Transfer to Toilet: with supervision;ambulating;bedside commode Pt Will Perform Toileting - Clothing Manipulation and hygiene: with supervision;sit to/from stand  OT Frequency: Min 2X/week   Barriers to D/C: Decreased caregiver support          Co-evaluation PT/OT/SLP Co-Evaluation/Treatment: Yes Reason for Co-Treatment: For patient/therapist safety   OT goals addressed during session: ADL\'s and self-care      AM-PAC PT "6 Clicks" Daily Activity     Outcome Measure Help from another person eating meals?: None Help from another person taking care of personal grooming?: A Little Help from another person toileting, which includes using toliet, bedpan, or urinal?: A Little Help from another person bathing (including washing, rinsing, drying)?: A Lot Help from another person to put on and taking off regular upper body clothing?: None Help from another person to put on and taking off regular lower body clothing?: A  Lot 6 Click Score: 18   End of Session Equipment Utilized During Treatment: Gait belt Nurse Communication: Mobility status  Activity Tolerance: Patient tolerated treatment well Patient left: in chair;with call bell/phone within reach;with chair alarm set;with family/visitor present  OT Visit Diagnosis: Unsteadiness on feet (R26.81);Other abnormalities of gait and mobility (R26.89);Pain                Time: 1000-1032 OT Time Calculation (min): 32 min Charges:  OT General Charges $OT Visit: 1 Visit OT Evaluation $OT Eval Moderate Complexity: 1 Mod G-Codes:     12 /27/2018 Martie RoundJulie Danitza Schoenfeldt, OTR/L Pager: [phone]  Iran PlanasMayberry, [place] BailiffJulie Lynn 05/24/2017, 10:46 AM

## 2017-05-24 NOTE — Progress Notes (Signed)
   Subjective:  Patient reports pain as mild.  Up with therapy today.  No complaints other than some hip soreness.  Denies SOB/CP/N/V  Objective:   VITALS:   Vitals:   05/23/17 2253 05/24/17 0258 05/24/17 0601 05/24/17 1356  BP: (!) 119/53 (!) 147/85 (!) 145/65 134/66  Pulse: (!) 58 61 (!) 59 65  Resp: 16 17 16 16   Temp: 98 F (36.7 C) 98.5 F (36.9 C) 98.2 F (36.8 C) 98.2 F (36.8 C)  TempSrc: Oral Oral Oral Oral  SpO2: 98% 98% 93% 94%  Weight:      Height:        Neurovascular intact Sensation intact distally Intact pulses distally Dorsiflexion/Plantar flexion intact Incision: moderate drainage No cellulitis present   Lab Results  Component Value Date   WBC 9.7 05/24/2017   HGB 12.0 05/24/2017   HCT 35.7 (L) 05/24/2017   MCV 95.5 05/24/2017   PLT 135 (L) 05/24/2017   BMET    Component Value Date/Time   NA 138 05/24/2017 0454   K 3.8 05/24/2017 0454   CL 108 05/24/2017 0454   CO2 26 05/24/2017 0454   GLUCOSE 154 (H) 05/24/2017 0454   BUN 16 05/24/2017 0454   CREATININE 0.71 05/24/2017 0454   CALCIUM 8.3 (L) 05/24/2017 0454   GFRNONAA >60 05/24/2017 0454   GFRAA >60 05/24/2017 0454     Assessment/Plan: 1 Day Post-Op   Principal Problem:   Fracture of femoral neck, left, closed (HCC) Active Problems:   Atrial fibrillation (HCC)   Chronic anticoagulation   Up with therapy -WBAT LLE - maintain dressing at this time, unless saturates to the edges - coumadin per primary team will cover for DVT ppx as well as SCDs, ok to resume today chemical anticoagulation. - follow up 2 weeks with Dr. Alois Clicheogers   [name] [name] 05/24/2017, 6:18 PM   Maryan RuedJason P Alyzah Pelly, MD [phone]

## 2017-05-24 NOTE — NC FL2 (Signed)
[place] MEDICAID FL2 LEVEL OF CARE SCREENING TOOL     IDENTIFICATION  Patient Name: [name] [name] Birthdate: 27-Sep-1927 Sex: female Admission Date (Current Location): 05/22/2017  [facility] [place]   Facility and Address:  [facility],  [address], [place] [id number]      Provider Number: [id number]  Attending Physician Name and Address:  Maxie BarbBhandari, Dron Prasad, MD  Relative Name and Phone Number:       Current Level of Care: SNF Recommended Level of Care: Skilled Nursing Facility Prior Approval Number:    Date Approved/Denied:   PASRR Number:   [id number]   Discharge Plan: SNF    Current Diagnoses: Patient Active Problem List   Diagnosis Date Noted  . Fracture of femoral neck, left, closed (HCC) 05/22/2017  . Bilateral carotid artery disease (HCC) 03/10/2014  . Raynaud's phenomenon 03/17/2013  . Patent foramen ovale 05/15/2011  . Chronic anticoagulation 05/15/2011  . Atrial fibrillation (HCC) 04/28/2008    Orientation RESPIRATION BLADDER Height & Weight     Self, Time, Situation, Place  Normal(2L) Continent Weight: 145 lb (65.8 kg) Height:  5\' 2"  (157.5 cm)  BEHAVIORAL SYMPTOMS/MOOD NEUROLOGICAL BOWEL NUTRITION STATUS      Continent Diet(Regular )  AMBULATORY STATUS COMMUNICATION OF NEEDS Skin   Extensive Assist Verbally (Surgical Incision )                       Personal Care Assistance Level of Assistance  Bathing, Feeding, Dressing Bathing Assistance: Limited assistance Feeding assistance: Independent Dressing Assistance: Limited assistance     Functional Limitations Info  Sight, Hearing, Speech Sight Info: Impaired Hearing Info: Adequate Speech Info: Adequate    SPECIAL CARE FACTORS FREQUENCY  PT (By licensed PT), OT (By licensed OT)     PT Frequency: 5x/week OT Frequency: 5x/week            Contractures Contractures Info: Present    Additional Factors Info  Code Status, Allergies  Code Status Info: Fullcode Allergies Info: Allergies: Penicillins, Cephalexin, Digoxin, Latex           Current Medications (05/24/2017):  This is the current hospital active medication list Current Facility-Administered Medications  Medication Dose Route Frequency Provider Last Rate Last Dose  . acetaminophen (TYLENOL) tablet 650 mg  650 mg Oral Q4H PRN Yolonda Kidaogers, Jason Patrick, MD       Or  . acetaminophen (TYLENOL) suppository 650 mg  650 mg Rectal Q4H PRN Yolonda Kidaogers, Jason Patrick, MD      . calcium-vitamin D (OSCAL WITH D) 500-200 MG-UNIT per tablet 1 tablet  1 tablet Oral Daily Hillary BowGardner, Jared M, DO   1 tablet at 05/24/17 [phone]  . docusate sodium (COLACE) capsule 100 mg  100 mg Oral QHS Lyda PeroneGardner, Jared M, DO   100 mg at 05/24/17 [phone]  . famotidine (PEPCID) IVPB 20 mg premix  20 mg Intravenous Q24H Maxie BarbBhandari, Dron Prasad, MD 100 mL/hr at 05/23/17 1332 20 mg at 05/23/17 1332  . flecainide (TAMBOCOR) tablet 50 mg  50 mg Oral BID Lyda PeroneGardner, Jared M, DO   50 mg at 05/24/17 [phone]  . HYDROcodone-acetaminophen (NORCO/VICODIN) 5-325 MG per tablet 1-2 tablet  1-2 tablet Oral Q6H PRN Hillary BowGardner, Jared M, DO   1 tablet at 05/24/17 0859  . metoCLOPramide (REGLAN) tablet 5-10 mg  5-10 mg Oral Q8H PRN Yolonda Kidaogers, Jason Patrick, MD       Or  . metoCLOPramide [facility](REGLAN) injection 5-10 mg  5-10 mg  Intravenous Q8H PRN Yolonda Kidaogers, Jason Patrick, MD      . metoprolol succinate (TOPROL-XL) 24 hr tablet 25 mg  25 mg Oral Daily Lyda PeroneGardner, Jared M, DO   25 mg at 05/22/17 2344  . metroNIDAZOLE (METROGEL) 0.75 % gel 1 application  1 application Topical Daily Hillary BowGardner, Jared M, DO   1 application at 05/24/17 [phone]  . morphine 2 MG/ML injection 0.5 mg  0.5 mg Intravenous Q2H PRN Hillary BowGardner, Jared M, DO      . multivitamin with minerals tablet 1 tablet  1 tablet Oral Daily Hillary BowGardner, Jared M, DO   1 tablet at 05/24/17 [phone]  . ondansetron (ZOFRAN) tablet 4 mg  4 mg Oral Q6H PRN Yolonda Kidaogers, Jason Patrick, MD       Or  . ondansetron [facility](ZOFRAN) injection 4  mg  4 mg Intravenous Q6H PRN Yolonda Kidaogers, Jason Patrick, MD      . psyllium (HYDROCIL/METAMUCIL) packet   Oral Daily Hillary BowGardner, Jared M, DO   1 packet at 05/24/17 [phone]  . senna (SENOKOT) tablet 8.6 mg  1 tablet Oral BID Yolonda Kidaogers, Jason Patrick, MD   8.6 mg at 05/24/17 [phone]     Discharge Medications: Please see discharge summary for a list of discharge medications.  Relevant Imaging Results:  Relevant Lab Results:   Additional Information ssn:[phone]  Clearance CootsNicole A Ashlea Dusing, LCSW

## 2017-05-24 NOTE — Progress Notes (Addendum)
PROGRESS NOTE    [name] FlowBetty F [name]  JYN:[id number] DOB: 02-10-1928 DOA: 05/22/2017 PCP: Juliette AlcideBurdine, Steven E, MD   Brief Narrative: 83 year old female with history of paroxysmal atrial fibrillation on Coumadin presented after a fall at home sustaining severe left hip pain.  Patient was found to have left hip fracture.  Orthopedics was consulted and admitted for further evaluation.  Plan for surgery today.  Assessment & Plan:   Principal Problem:   Fracture of femoral neck, left, closed (HCC) Active Problems:   Atrial fibrillation (HCC)   Chronic anticoagulation Constipation  -Status post ORIF for left hip fracture on 12/26 by orthopedics.  Complaining of pain.  Continue oral and IV pain medication.  On stool softeners including Metamucil senna.  Ordered PT and OT evaluation.  Patient will likely benefit from rehab on discharge.  Social worker consulted.  Resume Coumadin per pharmacy.  Monitor INR and labs.  Discussed with the patient and she verbalized understanding. -Heart rate is controlled, continue current medication including antiarrhythmic medication  DVT prophylaxis: Coumadin  code Status: Full code Family Communication: No family at bedside Disposition Plan: Likely discharge to rehab/SNF in 1-2 days    Consultants:   Orthopedics  Procedures: None Antimicrobials: None  Subjective: Seen and examined at bedside.  Finding of pain at the site of surgery, somewhat controlled with the current regimen.  Denied nausea vomiting chest pain shortness of breath.  No bowel movement since she came to hospital. Objective: Vitals:   05/23/17 2009 05/23/17 2253 05/24/17 0258 05/24/17 0601  BP: 117/66 (!) 119/53 (!) 147/85 (!) 145/65  Pulse: 61 (!) 58 61 (!) 59  Resp: 17 16 17 16   Temp: 98.4 F (36.9 C) 98 F (36.7 C) 98.5 F (36.9 C) 98.2 F (36.8 C)  TempSrc: Oral Oral Oral Oral  SpO2: 97% 98% 98% 93%  Weight:      Height:        Intake/Output Summary (Last 24 hours) at  05/24/2017 1219 Last data filed at 05/24/2017 0600 Gross per 24 hour  Intake 1620 ml  Output 1400 ml  Net 220 ml   Filed Weights   05/22/17 1652  Weight: 65.8 kg (145 lb)    Examination:  General exam: Not in distress Respiratory system: Clear bilateral, respiratory for normal  cardiovascular system: Regular rate rhythm S1-S2 normal. Gastrointestinal system: Abdomen soft, nontender nondistended.  Bowel sounds positive.   Central nervous system: Alert and oriented. No focal neurological deficits. Extremities:  no edema. Skin: No rashes, lesions or ulcers Psychiatry: Judgement and insight appear normal. Mood & affect appropriate.     Data Reviewed: I have personally reviewed following labs and imaging studies  CBC: Recent Labs  Lab 05/22/17 1942 05/24/17 0454  WBC 10.5 9.7  NEUTROABS 8.4*  --   HGB 13.9 12.0  HCT 41.5 35.7*  MCV 96.5 95.5  PLT 172 135*   Basic Metabolic Panel: Recent Labs  Lab 05/22/17 1942 05/24/17 0454  NA 138 138  K 3.8 3.8  CL 103 108  CO2 27 26  GLUCOSE 142* 154*  BUN 29* 16  CREATININE 0.85 0.71  CALCIUM 9.0 8.3*   GFR: Estimated Creatinine Clearance: 44.1 mL/min (by C-G formula based on SCr of 0.71 mg/dL). Liver Function Tests: No results for input(s): AST, ALT, ALKPHOS, BILITOT, PROT, ALBUMIN in the last 168 hours. No results for input(s): LIPASE, AMYLASE in the last 168 hours. No results for input(s): AMMONIA in the last 168 hours. Coagulation Profile: Recent Labs  Lab  05/22/17 1942 05/23/17 0842  INR 2.05 1.66   Cardiac Enzymes: No results for input(s): CKTOTAL, CKMB, CKMBINDEX, TROPONINI in the last 168 hours. BNP (last 3 results) No results for input(s): PROBNP in the last 8760 hours. HbA1C: No results for input(s): HGBA1C in the last 72 hours. CBG: Recent Labs  Lab 05/23/17 1526 05/23/17 1712  GLUCAP 97 107*   Lipid Profile: No results for input(s): CHOL, HDL, LDLCALC, TRIG, CHOLHDL, LDLDIRECT in the last 72  hours. Thyroid Function Tests: No results for input(s): TSH, T4TOTAL, FREET4, T3FREE, THYROIDAB in the last 72 hours. Anemia Panel: No results for input(s): VITAMINB12, FOLATE, FERRITIN, TIBC, IRON, RETICCTPCT in the last 72 hours. Sepsis Labs: No results for input(s): PROCALCITON, LATICACIDVEN in the last 168 hours.  Recent Results (from the past 240 hour(s))  Surgical pcr screen     Status: None   Collection Time: 05/22/17 11:56 PM  Result Value Ref Range Status   MRSA, PCR NEGATIVE NEGATIVE Final   Staphylococcus aureus NEGATIVE NEGATIVE Final    Comment: (NOTE) The Xpert SA Assay (FDA approved for NASAL specimens in patients 622 years of age and older), is one component of a comprehensive surveillance program. It is not intended to diagnose infection nor to guide or monitor treatment.          Radiology Studies: Ct Hip Left Wo Contrast  Result Date: 05/22/2017 CLINICAL DATA:  83 year old female with left hip fracture. EXAM: CT OF THE LEFT HIP WITHOUT CONTRAST TECHNIQUE: Multidetector CT imaging of the left hip was performed according to the standard protocol. Multiplanar CT image reconstructions were also generated. COMPARISON:  Left hip radiograph dated 05/22/2017 FINDINGS: Bones/Joint/Cartilage Evaluation for fracture is limited due to advanced osteopenia. There is a nondisplaced fracture of the left femoral neck. There is no dislocation. Ligaments Suboptimally assessed by CT. Muscles and Tendons No acute findings.  No intramuscular hematoma. Soft tissues Sigmoid diverticulosis. The soft tissues are otherwise unremarkable. IMPRESSION: Nondisplaced left femoral neck fracture. Electronically Signed   By: Elgie CollardArash  Radparvar M.D.   On: 05/22/2017 20:56   Dg Chest Port 1 View  Result Date: 05/22/2017 CLINICAL DATA:  Status post fall, left hip fracture EXAM: PORTABLE CHEST 1 VIEW COMPARISON:  None. FINDINGS: There is no focal parenchymal opacity. There is no pleural effusion or  pneumothorax. There is stable cardiomegaly. There is thoracic aortic atherosclerosis. The osseous structures are unremarkable. IMPRESSION: No active disease. Electronically Signed   By: Elige KoHetal  Patel   On: 05/22/2017 19:57   Dg C-arm 1-60 Min-no Report  Result Date: 05/23/2017 Fluoroscopy was utilized by the requesting physician.  No radiographic interpretation.   Dg Hip Operative Unilat W Or W/o Pelvis Left  Result Date: 05/23/2017 CLINICAL DATA:  Intraoperative fixation of a subcapital femoral neck fracture. EXAM: OPERATIVE left HIP (WITH PELVIS IF PERFORMED) 3 VIEWS TECHNIQUE: Fluoroscopic spot image(s) were submitted for interpretation post-operatively. COMPARISON:  Preoperative study of May 22, 2017 FINDINGS: Fluoro time reported is 48 seconds. 3 fluoro spot images reveal placement of 3 cortical screws traversing the intertrochanteric region and femoral neck and entering the femoral head for fixation of the left femoral neck fracture. There is no immediate postprocedure complication. IMPRESSION: The patient has undergone successful ORIF for a femoral neck fracture. Electronically Signed   By: David  Swazil[place]Jordan M.D.   On: 05/23/2017 16:56   Dg Hip Unilat W Or Wo Pelvis 2-3 Views Left  Result Date: 05/22/2017 CLINICAL DATA:  Status post fall, pain EXAM: DG HIP (WITH OR  WITHOUT PELVIS) 2-3V LEFT COMPARISON:  None. FINDINGS: Nondisplaced left subcapital femoral neck fracture. No other fracture or dislocation. Generalized osteopenia. Prior ORIF of a right femoral neck fracture. Lower lumbar spine spondylosis. IMPRESSION: Nondisplaced left subcapital femoral neck fracture. Electronically Signed   By: Elige Ko   On: 05/22/2017 18:56        Scheduled Meds: . calcium-vitamin D  1 tablet Oral Daily  . docusate sodium  100 mg Oral QHS  . flecainide  50 mg Oral BID  . metoprolol succinate  25 mg Oral Daily  . metroNIDAZOLE  1 application Topical Daily  . multivitamin with minerals  1 tablet  Oral Daily  . psyllium   Oral Daily  . senna  1 tablet Oral BID   Continuous Infusions: . dextrose 5 % and 0.9 % NaCl with KCl 20 mEq/L Stopped (05/24/17 0421)  . famotidine (PEPCID) IV 20 mg (05/23/17 1332)     LOS: 2 days    Marquell Saenz Jaynie Collins, MD [facility] Pager [phone]  If 7PM-7AM, please contact night-coverage [email or web address] Password [email or web address] 05/24/2017, 12:19 PM

## 2017-05-25 DIAGNOSIS — I480 Paroxysmal atrial fibrillation: Secondary | ICD-10-CM

## 2017-05-25 DIAGNOSIS — S72002G Fracture of unspecified part of neck of left femur, subsequent encounter for closed fracture with delayed healing: Secondary | ICD-10-CM

## 2017-05-25 DIAGNOSIS — Z7901 Long term (current) use of anticoagulants: Secondary | ICD-10-CM

## 2017-05-25 LAB — PROTIME-INR
INR: 1.3
PROTHROMBIN TIME: 16.1 s — AB (ref 11.4–15.2)

## 2017-05-25 MED ORDER — FAMOTIDINE 20 MG PO TABS
20.0000 mg | ORAL_TABLET | Freq: Every day | ORAL | Status: DC
  Administered 2017-05-25: 20 mg via ORAL
  Filled 2017-05-25: qty 1

## 2017-05-25 MED ORDER — ENOXAPARIN SODIUM 80 MG/0.8ML ~~LOC~~ SOLN
65.0000 mg | Freq: Two times a day (BID) | SUBCUTANEOUS | Status: DC
  Administered 2017-05-25: 65 mg via SUBCUTANEOUS
  Filled 2017-05-25: qty 0.8

## 2017-05-25 MED ORDER — SENNA 8.6 MG PO TABS: 1.0000 | ORAL_TABLET | Freq: Two times a day (BID) | ORAL | 0 refills | Status: DC

## 2017-05-25 MED ORDER — WARFARIN SODIUM 6 MG PO TABS
6.0000 mg | ORAL_TABLET | Freq: Once | ORAL | Status: AC
  Administered 2017-05-25: 6 mg via ORAL
  Filled 2017-05-25: qty 1

## 2017-05-25 MED ORDER — DOCUSATE SODIUM 100 MG PO CAPS: 100.0000 mg | ORAL_CAPSULE | Freq: Two times a day (BID) | ORAL | 0 refills | Status: DC

## 2017-05-25 MED ORDER — ENOXAPARIN SODIUM 80 MG/0.8ML ~~LOC~~ SOLN: 65.0000 mg | Freq: Two times a day (BID) | SUBCUTANEOUS | 0 refills | Status: DC

## 2017-05-25 NOTE — Progress Notes (Signed)
Pt alert and oriented. Transferred to SNF.

## 2017-05-25 NOTE — Discharge Summary (Signed)
Physician Discharge Summary  [name] [name] ZOX:[id number] DOB: 1928/04/16 DOA: 05/22/2017  PCP: Juliette Alcide, MD  Admit date: 05/22/2017 Discharge date: 05/25/2017  Admitted From: Home Disposition:  SNF   Recommendations for Outpatient Follow-up:  1. Follow up with PCP in 1-2 weeks 2. Please obtain BMP/CBC in one week 3. Please check INR on 05/28/17 and adjust coumadin dose for INR 2-3 4. Please continue lovenox [place] until INR > 2.0 5. Discontinue lovenox [place] when INR >2.0   Discharge Condition: Stable CODE STATUS: FULL Diet recommendation: Heart Healthy / Carb Modified    Brief/Interim Summary: 83 year old female with a history of paroxysmal atrial fibrillation, diabetes mellitus, presented after mechanical fall after she lost balance.  X-rays in the emergency department revealed a nondisplaced left subcapital femoral neck fracture.  Orthopedics was consulted.  The patient was taken to the operating room on May 23, 2017 where ORIF was performed by Dr. Aundria Rud.  Postoperatively, physical therapy was consulted and recommended skilled nursing facility.  The patient's pain was controlled.  The patient's warfarin was resumed.    Discharge Diagnoses:  Left femoral neck fracture -Secondary to mechanical fall -Status post ORIF 05/23/2017--Dr. Duwayne Heck -[facility] -Pain control with hydrocodone -Continue Colace and Senokot  Paroxysmal atrial fibrillation -Remains in sinus rhythm -Patient had reversal of Coumadin prior to surgery -Resume Coumadin for DVT  - start Lovenox bridge as the patient has had a history of TIA  -Continue Lovenox until INR greater than 2.0, then discontinue Lovenox -Continue metoprolol succinate and flecainide  Diet controlled Diabetes mellitus type 2 -CBGs controlled  Rosacea -Continue MetroGel and doxycycline Discharge Instructions  Discharge Instructions    Diet - low sodium heart healthy   Complete by:  As directed    Increase activity  slowly   Complete by:  As directed      Allergies as of 05/25/2017      Reactions   Penicillins Hives   Cephalexin Nausea And Vomiting   REACTION: nausea REACTION: nausea   Digoxin    REACTION: rash on legs   Latex       Medication List    TAKE these medications   CALCIUM-VITAMIN D PO Take 1 tablet by mouth daily.   docusate sodium 100 MG capsule Commonly known as:  COLACE Take 1 capsule (100 mg total) by mouth 2 (two) times daily. Dosage not specified What changed:  when to take this   doxycycline 100 MG DR capsule Commonly known as:  DORYX Take 100 mg by mouth daily.   enoxaparin 80 MG/0.8ML injection Commonly known as:  LOVENOX Inject 0.65 mLs (65 mg total) into the skin every 12 (twelve) hours. Until INR >2.0   flecainide 50 MG tablet Commonly known as:  TAMBOCOR Take 1 tablet (50 mg total) by mouth 2 (two) times daily.   furosemide 20 MG tablet Commonly known as:  LASIX Take 20 mg by mouth daily as needed.   HYDROcodone-acetaminophen 5-325 MG tablet Commonly known as:  NORCO/VICODIN Take 1-2 tablets by mouth every 6 (six) hours as needed for moderate pain.   METAMUCIL 30.9 % Powd Generic drug:  Psyllium Take by mouth daily.   metoprolol succinate 25 MG 24 hr tablet Commonly known as:  TOPROL XL Take 1 tablet (25 mg total) by mouth daily.   metroNIDAZOLE 0.75 % cream Commonly known as:  METROCREAM Apply 1 application topically daily.   multivitamin tablet Take 1 tablet by mouth daily.   potassium chloride SA 20 MEQ tablet Commonly  known as:  K-DUR,KLOR-CON Take 20 mEq by mouth daily.   senna 8.6 MG Tabs tablet Commonly known as:  SENOKOT Take 1 tablet (8.6 mg total) by mouth 2 (two) times daily.   warfarin 5 MG tablet Commonly known as:  COUMADIN Take 5 mg by mouth daily. Managed by Dr. Leandrew Koyanagi.  UAD. M& F 2.5 mg TWTh, SaSu: 5 mg       Contact information for follow-up providers    Yolonda Kida, MD. Schedule an appointment as  soon as possible for a visit in 2 weeks.   Specialty:  Orthopedic Surgery Why:  For suture removal, For wound re-check Contact information: [address] [place] [address] [phone]            Contact information for after-discharge care    Destination    [facility] .   Service:  Skilled Nursing Contact information: [address] [place] [address] [phone]                 Allergies  Allergen Reactions  . Penicillins Hives  . Cephalexin Nausea And Vomiting    REACTION: nausea REACTION: nausea  . Digoxin     REACTION: rash on legs  . Latex     Consultations:  Ortho--Dr. Duwayne Heck   Procedures/Studies: Ct Hip Left Wo Contrast  Result Date: 05/22/2017 CLINICAL DATA:  83 year old female with left hip fracture. EXAM: CT OF THE LEFT HIP WITHOUT CONTRAST TECHNIQUE: Multidetector CT imaging of the left hip was performed according to the standard protocol. Multiplanar CT image reconstructions were also generated. COMPARISON:  Left hip radiograph dated 05/22/2017 FINDINGS: Bones/Joint/Cartilage Evaluation for fracture is limited due to advanced osteopenia. There is a nondisplaced fracture of the left femoral neck. There is no dislocation. Ligaments Suboptimally assessed by CT. Muscles and Tendons No acute findings.  No intramuscular hematoma. Soft tissues Sigmoid diverticulosis. The soft tissues are otherwise unremarkable. IMPRESSION: Nondisplaced left femoral neck fracture. Electronically Signed   By: Elgie Collard M.D.   On: 05/22/2017 20:56   Dg Chest Port 1 View  Result Date: 05/22/2017 CLINICAL DATA:  Status post fall, left hip fracture EXAM: PORTABLE CHEST 1 VIEW COMPARISON:  None. FINDINGS: There is no focal parenchymal opacity. There is no pleural effusion or pneumothorax. There is stable cardiomegaly. There is thoracic aortic atherosclerosis. The osseous structures are unremarkable. IMPRESSION: No  active disease. Electronically Signed   By: Elige Ko   On: 05/22/2017 19:57   Dg C-arm 1-60 Min-no Report  Result Date: 05/23/2017 Fluoroscopy was utilized by the requesting physician.  No radiographic interpretation.   Dg Hip Operative Unilat W Or W/o Pelvis Left  Result Date: 05/23/2017 CLINICAL DATA:  Intraoperative fixation of a subcapital femoral neck fracture. EXAM: OPERATIVE left HIP (WITH PELVIS IF PERFORMED) 3 VIEWS TECHNIQUE: Fluoroscopic spot image(s) were submitted for interpretation post-operatively. COMPARISON:  Preoperative study of May 22, 2017 FINDINGS: Fluoro time reported is 48 seconds. 3 fluoro spot images reveal placement of 3 cortical screws traversing the intertrochanteric region and femoral neck and entering the femoral head for fixation of the left femoral neck fracture. There is no immediate postprocedure complication. IMPRESSION: The patient has undergone successful ORIF for a femoral neck fracture. Electronically Signed   By: Etta Gassett  [place] M.D.   On: 05/23/2017 16:56   Dg Hip Unilat W Or Wo Pelvis 2-3 Views Left  Result Date: 05/22/2017 CLINICAL DATA:  Status post fall, pain EXAM: DG HIP (WITH  OR WITHOUT PELVIS) 2-3V LEFT COMPARISON:  None. FINDINGS: Nondisplaced left subcapital femoral neck fracture. No other fracture or dislocation. Generalized osteopenia. Prior ORIF of a right femoral neck fracture. Lower lumbar spine spondylosis. IMPRESSION: Nondisplaced left subcapital femoral neck fracture. Electronically Signed   By: Elige KoHetal  Patel   On: 05/22/2017 18:56         Discharge Exam: Vitals:   05/24/17 2042 05/25/17 0541  BP: (!) 165/62 (!) 162/67  Pulse: 69 69  Resp: 18 18  Temp: 99.3 F (37.4 C) 99.3 F (37.4 C)  SpO2: 93% 94%   Vitals:   05/24/17 0601 05/24/17 1356 05/24/17 2042 05/25/17 0541  BP: (!) 145/65 134/66 (!) 165/62 (!) 162/67  Pulse: (!) 59 65 69 69  Resp: 16 16 18 18   Temp: 98.2 F (36.8 C) 98.2 F (36.8 C) 99.3 F (37.4 C)  99.3 F (37.4 C)  TempSrc: Oral Oral Oral Oral  SpO2: 93% 94% 93% 94%  Weight:      Height:        General: Pt is alert, awake, not in acute distress Cardiovascular: RRR, S1/S2 +, no rubs, no gallops Respiratory: CTA bilaterally, no wheezing, no rhonchi Abdominal: Soft, NT, ND, bowel sounds + Extremities: no edema, no cyanosis   The results of significant diagnostics from this hospitalization (including imaging, microbiology, ancillary and laboratory) are listed below for reference.    Significant Diagnostic Studies: Ct Hip Left Wo Contrast  Result Date: 05/22/2017 CLINICAL DATA:  83 year old female with left hip fracture. EXAM: CT OF THE LEFT HIP WITHOUT CONTRAST TECHNIQUE: Multidetector CT imaging of the left hip was performed according to the standard protocol. Multiplanar CT image reconstructions were also generated. COMPARISON:  Left hip radiograph dated 05/22/2017 FINDINGS: Bones/Joint/Cartilage Evaluation for fracture is limited due to advanced osteopenia. There is a nondisplaced fracture of the left femoral neck. There is no dislocation. Ligaments Suboptimally assessed by CT. Muscles and Tendons No acute findings.  No intramuscular hematoma. Soft tissues Sigmoid diverticulosis. The soft tissues are otherwise unremarkable. IMPRESSION: Nondisplaced left femoral neck fracture. Electronically Signed   By: Elgie CollardArash  Radparvar M.D.   On: 05/22/2017 20:56   Dg Chest Port 1 View  Result Date: 05/22/2017 CLINICAL DATA:  Status post fall, left hip fracture EXAM: PORTABLE CHEST 1 VIEW COMPARISON:  None. FINDINGS: There is no focal parenchymal opacity. There is no pleural effusion or pneumothorax. There is stable cardiomegaly. There is thoracic aortic atherosclerosis. The osseous structures are unremarkable. IMPRESSION: No active disease. Electronically Signed   By: Elige KoHetal  Patel   On: 05/22/2017 19:57   Dg C-arm 1-60 Min-no Report  Result Date: 05/23/2017 Fluoroscopy was utilized by the  requesting physician.  No radiographic interpretation.   Dg Hip Operative Unilat W Or W/o Pelvis Left  Result Date: 05/23/2017 CLINICAL DATA:  Intraoperative fixation of a subcapital femoral neck fracture. EXAM: OPERATIVE left HIP (WITH PELVIS IF PERFORMED) 3 VIEWS TECHNIQUE: Fluoroscopic spot image(s) were submitted for interpretation post-operatively. COMPARISON:  Preoperative study of May 22, 2017 FINDINGS: Fluoro time reported is 48 seconds. 3 fluoro spot images reveal placement of 3 cortical screws traversing the intertrochanteric region and femoral neck and entering the femoral head for fixation of the left femoral neck fracture. There is no immediate postprocedure complication. IMPRESSION: The patient has undergone successful ORIF for a femoral neck fracture. Electronically Signed   By: Mikelle Myrick  [place] M.D.   On: 05/23/2017 16:56   Dg Hip Unilat W Or Wo Pelvis 2-3 Views Left  Result  Date: 05/22/2017 CLINICAL DATA:  Status post fall, pain EXAM: DG HIP (WITH OR WITHOUT PELVIS) 2-3V LEFT COMPARISON:  None. FINDINGS: Nondisplaced left subcapital femoral neck fracture. No other fracture or dislocation. Generalized osteopenia. Prior ORIF of a right femoral neck fracture. Lower lumbar spine spondylosis. IMPRESSION: Nondisplaced left subcapital femoral neck fracture. Electronically Signed   By: Elige KoHetal  Patel   On: 05/22/2017 18:56     Microbiology: Recent Results (from the past 240 hour(s))  Surgical pcr screen     Status: None   Collection Time: 05/22/17 11:56 PM  Result Value Ref Range Status   MRSA, PCR NEGATIVE NEGATIVE Final   Staphylococcus aureus NEGATIVE NEGATIVE Final    Comment: (NOTE) The Xpert SA Assay (FDA approved for NASAL specimens in patients 81 years of age and older), is one component of a comprehensive surveillance program. It is not intended to diagnose infection nor to guide or monitor treatment.      Labs: Basic Metabolic Panel: Recent Labs  Lab 05/22/17 1942  05/24/17 0454  NA 138 138  K 3.8 3.8  CL 103 108  CO2 27 26  GLUCOSE 142* 154*  BUN 29* 16  CREATININE 0.85 0.71  CALCIUM 9.0 8.3*   Liver Function Tests: No results for input(s): AST, ALT, ALKPHOS, BILITOT, PROT, ALBUMIN in the last 168 hours. No results for input(s): LIPASE, AMYLASE in the last 168 hours. No results for input(s): AMMONIA in the last 168 hours. CBC: Recent Labs  Lab 05/22/17 1942 05/24/17 0454  WBC 10.5 9.7  NEUTROABS 8.4*  --   HGB 13.9 12.0  HCT 41.5 35.7*  MCV 96.5 95.5  PLT 172 135*   Cardiac Enzymes: No results for input(s): CKTOTAL, CKMB, CKMBINDEX, TROPONINI in the last 168 hours. BNP: Invalid input(s): POCBNP CBG: Recent Labs  Lab 05/23/17 1526 05/23/17 1712  GLUCAP 97 107*    Time coordinating discharge:  Greater than 30 minutes  Signed:  Catarina Hartshornavid Nole Robey, DO [facility] Pager: [phone] 05/25/2017, 12:09 PM

## 2017-05-25 NOTE — Progress Notes (Signed)
PHARMACIST - PHYSICIAN COMMUNICATION  DR:   Tat  CONCERNING: IV to Oral Route Change Policy  RECOMMENDATION: This patient is receiving Famotidine by the intravenous route.  Based on criteria approved by the Pharmacy and Therapeutics Committee, the intravenous medication(s) is/are being converted to the equivalent oral dose form(s).   DESCRIPTION: These criteria include:  The patient is eating (either orally or via tube) and/or has been taking other orally administered medications for a least 24 hours  The patient has no evidence of active gastrointestinal bleeding or impaired GI absorption (gastrectomy, short bowel, patient on TNA or NPO).  If you have questions about this conversion, please contact the Pharmacy Department  [phone]( [phone])  [facility]   [place]ristine Mahmud Keithly PharmD, [place] Pager [phone] 05/25/2017 10:22 AM

## 2017-05-25 NOTE — Care Management Important Message (Signed)
Important Message  Patient Details  Name: [name] [name] MRN: [id number] Date of Birth: 08-14-1927   Medicare Important Message Given:  Yes    Caren MacadamFuller, Sevilla Murtagh 05/25/2017, 10:01 AMImportant Message  Patient Details  Name: [name] [name] MRN: [id number] Date of Birth: 08-14-1927   Medicare Important Message Given:  Yes    Caren MacadamFuller, Khoi Hamberger 05/25/2017, 10:01 AM

## 2017-05-25 NOTE — Clinical Social Work Placement (Addendum)
D/C summary sent via HUB. [facility] called for transport.  Nurse given number to call report.   CLINICAL SOCIAL WORK PLACEMENT  NOTE  Date:  05/25/2017  Patient Details  Name: [name] [name] FlowBetty F Brislin MRN: [id number] Date of Birth: June 29, 1927  Clinical Social Work is seeking post-discharge placement for this patient at the Skilled  Nursing Facility level of care (*CSW will initial, date and re-position this form in  chart as items are completed):  Yes   Patient/family provided with [facility] Clinical Social Work Department's list of facilities offering this level of care within the geographic area requested by the patient (or if unable, by the patient's family).  Yes   Patient/family informed of their freedom to choose among providers that offer the needed level of care, that participate in Medicare, Medicaid or managed care program needed by the patient, have an available bed and are willing to accept the patient.  Yes   Patient/family informed of [facility]'s ownership interest [facility] and [facility], as well as of the fact that they are under no obligation to receive care at these facilities.  PASRR submitted to EDS on       PASRR number received on       Existing PASRR number confirmed on 05/24/17     FL2 transmitted to all facilities in geographic area requested by pt/family on       FL2 transmitted to all facilities within larger geographic area on       Patient informed that his/her managed care company has contracts with or will negotiate with certain facilities, including the following:  [facility]     Yes   Patient/family informed of bed offers received.  [facility]     Physician recommends and patient chooses bed at      [facility].  Patient to be transferred to facility by [facility]     Patient family notified on 05/25/17 of transfer.  Name of family member  notified:  Patient notified son of transfer.      PHYSICIAN Please prepare priority discharge summary, including medications    Please sign FL2  Additional Comment:    _______________________________________________ Clearance CootsNicole A Margarethe Virgen, LCSW 05/25/2017, 10:09 AM

## 2017-05-25 NOTE — Progress Notes (Signed)
ANTICOAGULATION CONSULT NOTE - Initial Consult  Pharmacy Consult for Warfarin Indication: atrial fibrillation  Allergies  Allergen Reactions  . Penicillins Hives  . Cephalexin Nausea And Vomiting    REACTION: nausea REACTION: nausea  . Digoxin     REACTION: rash on legs  . Latex     Patient Measurements: Height: 5\' 2"  (157.5 cm) Weight: 145 lb (65.8 kg) IBW/kg (Calculated) : 50.1  Vital Signs: Temp: 99.3 F (37.4 C) (12/28 0541) Temp Source: Oral (12/28 0541) BP: 162/67 (12/28 0541) Pulse Rate: 69 (12/28 0541)  Labs: Recent Labs    05/22/17 1942 05/23/17 0842 05/24/17 0454 05/24/17 1400 05/25/17 0505  HGB 13.9  --  12.0  --   --   HCT 41.5  --  35.7*  --   --   PLT 172  --  135*  --   --   LABPROT 23.0* 19.5*  --  15.7* 16.1*  INR 2.05 1.66  --  1.26 1.30  CREATININE 0.85  --  0.71  --   --     Estimated Creatinine Clearance: 44.1 mL/min (by C-G formula based on SCr of 0.71 mg/dL).   Medical History: Past Medical History:  Diagnosis Date  . Chronic edema   . History of cardiovascular stress test    Negative dobutamine echo 6/13  . Osteoarthritis   . Paroxysmal atrial fibrillation (HCC)   . PFO (patent foramen ovale)   . Rosacea   . Type 2 diabetes mellitus (HCC)     Assessment: [age] yoF admitted after a fall with hip fracture.  S/p ORIF on 12/26.  PMH includes Afib on chronic warfarin anticoagulation which was held on 12/26 and reversed with Vit K 1mg  IV on 12/25 PM.  Pharmacy is now consulted to resume warfarin dosing.  Home warfarin dose 5 mg daily except 2.5 mg on Monday and Fridays.  Last dose on 12/24 at 1700. Admission INR 2.05  Today, 05/25/2017: INR 1.3 CBC: Hgb decreased to 12, Plt decreased to 135k (12/27) No postop bleeding or complications reported. Diet: regular Drug-drug interactions: Vit K given 12/25 may suppress INR   Goal of Therapy:  INR 2-3 Monitor platelets by anticoagulation protocol: Yes   Plan:  Warfarin 6 mg PO,  repeat boosted dose, give early at 1200 Daily PT/INR. Monitor for signs and symptoms of bleeding.  Lynann Beaverhristine Stormi Vandevelde PharmD, BCPS Pager [phone] 05/25/2017 10:18 AM

## 2017-05-26 ENCOUNTER — Encounter (HOSPITAL_COMMUNITY): Payer: Self-pay | Admitting: Orthopedic Surgery

## 2017-05-26 NOTE — Anesthesia Postprocedure Evaluation (Signed)
Anesthesia Post Note  Patient: Mateo FlowBetty F Seales  Procedure(s) Performed: Percutaneous Pinning of the left hip (Left Hip)     Patient location during evaluation: PACU Anesthesia Type: General Level of consciousness: awake and patient cooperative Pain management: pain level controlled Vital Signs Assessment: post-procedure vital signs reviewed and stable Respiratory status: spontaneous breathing, nonlabored ventilation, respiratory function stable and patient connected to nasal cannula oxygen Cardiovascular status: blood pressure returned to baseline and stable Postop Assessment: no apparent nausea or vomiting Anesthetic complications: no    Last Vitals:  Vitals:   05/25/17 0541 05/25/17 1400  BP: (!) 162/67 (!) 151/71  Pulse: 69 86  Resp: 18 18  Temp: 37.4 C 37.4 C  SpO2: 94% 93%    Last Pain:  Vitals:   05/25/17 1400  TempSrc: Oral  PainSc:                  Elvi Leventhal

## 2017-05-26 NOTE — Anesthesia Preprocedure Evaluation (Signed)
Anesthesia Evaluation  Patient identified by MRN, date of birth, ID band Patient awake    Reviewed: Allergy & Precautions, NPO status , Patient's Chart, lab work & pertinent test results  History of Anesthesia Complications Negative for: history of anesthetic complications  Airway Mallampati: I  TM Distance: >3 FB Neck ROM: Full    Dental  (+) Dental Advisory Given   Pulmonary neg pulmonary ROS,    breath sounds clear to auscultation       Cardiovascular + Peripheral Vascular Disease  + dysrhythmias Atrial Fibrillation  Rhythm:Regular     Neuro/Psych negative neurological ROS  negative psych ROS   GI/Hepatic negative GI ROS, Neg liver ROS,   Endo/Other  diabetes, Type 2  Renal/GU      Musculoskeletal  (+) Arthritis , Left hip fx   Abdominal   Peds  Hematology   Anesthesia Other Findings   Reproductive/Obstetrics                             Anesthesia Physical Anesthesia Plan  ASA: II  Anesthesia Plan: General   Post-op Pain Management:    Induction: Intravenous  PONV Risk Score and Plan: 3 and Ondansetron and Treatment may vary due to age or medical condition  Airway Management Planned: Oral ETT  Additional Equipment: None  Intra-op Plan:   Post-operative Plan: Extubation in OR  Informed Consent: I have reviewed the patients History and Physical, chart, labs and discussed the procedure including the risks, benefits and alternatives for the proposed anesthesia with the patient or authorized representative who has indicated his/her understanding and acceptance.   Dental advisory given  Plan Discussed with: CRNA and Surgeon  Anesthesia Plan Comments:         Anesthesia Quick Evaluation

## 2017-07-03 ENCOUNTER — Ambulatory Visit: Payer: Medicare Other | Admitting: Cardiology

## 2017-07-12 ENCOUNTER — Ambulatory Visit: Payer: Medicare Other | Admitting: Neurology

## 2017-07-13 ENCOUNTER — Encounter: Payer: Self-pay | Admitting: Cardiology

## 2017-07-13 ENCOUNTER — Ambulatory Visit (INDEPENDENT_AMBULATORY_CARE_PROVIDER_SITE_OTHER): Payer: Medicare Other | Admitting: Cardiology

## 2017-07-13 VITALS — BP 138/64 | HR 64 | Ht 62.0 in | Wt 147.0 lb

## 2017-07-13 DIAGNOSIS — I480 Paroxysmal atrial fibrillation: Secondary | ICD-10-CM

## 2017-07-13 DIAGNOSIS — R600 Localized edema: Secondary | ICD-10-CM | POA: Diagnosis not present

## 2017-07-13 NOTE — Patient Instructions (Addendum)
Medication Instructions:  Continue all current medications.  Labwork: none  Testing/Procedures: none  Follow-Up: 4 months   Any Other Special Instructions Will Be Listed Below (If Applicable).  If you need a refill on your cardiac medications before your next appointment, please call your pharmacy.\ 

## 2017-07-13 NOTE — Progress Notes (Signed)
Cardiology Office Note  Date: 07/13/2017   ID: [name] [name], DOB 1927/11/20, MRN [id number]  PCP: Juliette Alcide, MD  Primary Cardiologist: Nona Dell, MD   Chief Complaint  Patient presents with  . PAF    History of Present Illness: [name] [name] is an 84 y.o. female last seen in his November 2018.  Record review finds hospitalization in December 2018 after a mechanical fall with resultant left femoral neck fracture.  She underwent ORIF by Dr. Aundria Rud.  Subsequent to that she went into rehabilitation in [place], then had an episode of acute blood loss anemia in the setting of pelvic hematoma with right inferior epigastric artery embolization done at [facility].  She required PRBC transfusions and was taken off Coumadin.  She just got out of a repeat stay in rehabilitation earlier this week.  Today we went over her medications.  She has decided not to resume any form of anticoagulation at this point.  She has continued on flecainide and Toprol-XL and remains in sinus rhythm.  She does not report any chest pain or recent palpitations.  Past Medical History:  Diagnosis Date  . Chronic edema   . History of cardiovascular stress test    Negative dobutamine echo 6/13  . Osteoarthritis   . Paroxysmal atrial fibrillation (HCC)   . PFO (patent foramen ovale)   . Rosacea   . Type 2 diabetes mellitus (HCC)     Past Surgical History:  Procedure Laterality Date  . ANKLE SURGERY Left   . HIP SURGERY Right   . INTRAMEDULLARY (IM) NAIL INTERTROCHANTERIC Left 05/23/2017   Procedure: Percutaneous Pinning of the left hip;  Surgeon: Yolonda Kida, MD;  Location: [facility];  Service: Orthopedics;  Laterality: Left;  . PARTIAL HYSTERECTOMY     fibroid tumors   . RECTOCELE REPAIR    . REPLACEMENT TOTAL KNEE BILATERAL      Current Outpatient Medications  Medication Sig Dispense Refill  . CALCIUM-VITAMIN D PO Take 1 tablet by mouth daily.     Marland Kitchen docusate sodium (COLACE) 100 MG  capsule Take 1 capsule (100 mg total) by mouth 2 (two) times daily. Dosage not specified 10 capsule 0  . flecainide (TAMBOCOR) 50 MG tablet Take 1 tablet (50 mg total) by mouth 2 (two) times daily. 180 tablet 0  . furosemide (LASIX) 40 MG tablet Take 40 mg by mouth daily.    . metoprolol succinate (TOPROL XL) 25 MG 24 hr tablet Take 1 tablet (25 mg total) by mouth daily. 30 tablet 3  . Multiple Vitamin (MULTIVITAMIN) tablet Take 1 tablet by mouth daily.      . potassium chloride SA (K-DUR,KLOR-CON) 20 MEQ tablet Take 20 mEq by mouth daily.    Marland Kitchen senna (SENOKOT) 8.6 MG TABS tablet Take 1 tablet (8.6 mg total) by mouth 2 (two) times daily. 60 each 0   No current facility-administered medications for this visit.    Allergies:  Penicillins; Cephalexin; Penicillin g; Digoxin; and Latex   Social History: The patient  reports that  has never smoked. she has never used smokeless tobacco. She reports that she does not drink alcohol or use drugs.   ROS:  Please see the history of present illness. Otherwise, complete review of systems is positive for improving hip stiffness, using a cane.  All other systems are reviewed and negative.   Physical Exam: VS:  BP 138/64   Pulse 64   Ht 5\' 2"  (1.575 m)   Wt  147 lb (66.7 kg)   SpO2 93%   BMI 26.89 kg/m , BMI Body mass index is 26.89 kg/m.  Wt Readings from Last 3 Encounters:  07/13/17 147 lb (66.7 kg)  05/22/17 145 lb (65.8 kg)  03/30/17 147 lb 3.2 oz (66.8 kg)    General: Elderly woman, no distress. HEENT: Conjunctiva and lids normal, oropharynx clear. Neck: Supple, no elevated JVP or carotid bruits, no thyromegaly. Lungs: Clear to auscultation, nonlabored breathing at rest. Cardiac: Regular rate and rhythm, no S3 or significant systolic murmur. Abdomen: Soft, nontender, bowel sounds present. Extremities: No pitting edema, distal pulses 2+.  ECG: I personally reviewed the tracing from 05/22/2017 which showed sinus rhythm with poor R wave  progression and left anterior fascicular block.  Recent Labwork: 05/24/2017: BUN 16; Creatinine, Ser 0.71; Hemoglobin 12.0; Platelets 135; Potassium 3.8; Sodium 138   Other Studies Reviewed Today:  Lexiscan Myoview 03/16/2016 [facility] No myocardial perfusion defects to indicate scar or ischemia, LVEF 69%.  Assessment and Plan:  1.  Paroxysmal atrial fibrillation.  Anticoagulation has been discontinued as discussed above.  She will continue on low-dose Toprol-XL as well as flecainide.  2.  Intermittent leg edema, continues on Lasix.  Current medicines were reviewed with the patient today.  Disposition: Follow-up in 4 months.  Signed, Jonelle SidleSamuel G. Atia Haupt, MD, [facility] 07/13/2017 3:52 PM    [facility] [address], [place], [place] [address] Phone: [phone]; Fax: [phone]) [phone]

## 2017-09-27 ENCOUNTER — Telehealth: Payer: Self-pay | Admitting: Cardiology

## 2017-09-27 MED ORDER — METOPROLOL SUCCINATE ER 25 MG PO TB24: 25.0000 mg | ORAL_TABLET | Freq: Every day | ORAL | 1 refills | Status: DC

## 2017-09-27 NOTE — Telephone Encounter (Signed)
Would continue with Toprol-XL 25 mg daily for now.

## 2017-09-27 NOTE — Telephone Encounter (Signed)
tp says since January 2019 discharge from [facility] has been taking Lopressor 25 mg bid instead of Toprol XL 25 mg daily. Says she only has 15 pills left - per 2/19 OV with Dr Diona Browner pt should be on Toprol XL 25 mg daily (decreased from 50 mg in 03/2017) pt needs refills and will confirm with provider on which medication to refill Lopressor or Toprol XL

## 2017-09-27 NOTE — Telephone Encounter (Signed)
Patient called stating that her metoprolol succinate (TOPROL XL) 25 MG 24 was called to [facility] last week and they only gave her 30 tabs. She states that she is taking 2 tabs daily.

## 2017-09-27 NOTE — Telephone Encounter (Signed)
Pt aware and voiced understanding - #90 of Toprol XL 25 mg sent to [facility] as requested.

## 2017-11-18 NOTE — Progress Notes (Signed)
Cardiology Office Note  Date: 11/19/2017   ID: [name] FlowBetty F Heather, DOB 1927/11/11, MRN [id number]  PCP: Juliette AlcideBurdine, Steven E, MD  Primary Cardiologist: Nona DellSamuel McDowell, MD   Chief Complaint  Patient presents with  . PAF    History of Present Illness: [name] [name] is an 84 y.o. female last seen in February.  She presents for a routine follow-up visit, states that intermittent palpitations have been infrequent.  She does not have any active exertional chest pain.  Mainly complains of chronic lower back pain, using a cane to ambulate.  I personally reviewed her ECG today which shows sinus bradycardia with left anterior fascicular block, QRS duration 92 ms, nonspecific T wave changes.  She is no longer on anticoagulation with bleeding history as detailed in the previous office note.  Current cardiac regimen includes flecainide and Toprol-XL.  Past Medical History:  Diagnosis Date  . Chronic edema   . History of cardiovascular stress test    Negative dobutamine echo 6/13  . Osteoarthritis   . Paroxysmal atrial fibrillation (HCC)   . PFO (patent foramen ovale)   . Rosacea   . Type 2 diabetes mellitus (HCC)     Past Surgical History:  Procedure Laterality Date  . ANKLE SURGERY Left   . HIP SURGERY Right   . INTRAMEDULLARY (IM) NAIL INTERTROCHANTERIC Left 05/23/2017   Procedure: Percutaneous Pinning of the left hip;  Surgeon: Yolonda Kidaogers, Jason Patrick, MD;  Location: [facility];  Service: Orthopedics;  Laterality: Left;  . PARTIAL HYSTERECTOMY     fibroid tumors   . RECTOCELE REPAIR    . REPLACEMENT TOTAL KNEE BILATERAL      Current Outpatient Medications  Medication Sig Dispense Refill  . CALCIUM-VITAMIN D PO Take 1 tablet by mouth daily.     Marland Kitchen. docusate sodium (COLACE) 100 MG capsule Take 100 mg by mouth daily.    . flecainide (TAMBOCOR) 50 MG tablet Take 1 tablet (50 mg total) by mouth 2 (two) times daily. 180 tablet 0  . furosemide (LASIX) 40 MG tablet Take 40 mg by mouth  daily.    . metoprolol succinate (TOPROL XL) 25 MG 24 hr tablet Take 1 tablet (25 mg total) by mouth daily. 90 tablet 1  . Multiple Vitamin (MULTIVITAMIN) tablet Take 1 tablet by mouth daily.      . potassium chloride SA (K-DUR,KLOR-CON) 20 MEQ tablet Take 20 mEq by mouth daily.    Marland Kitchen. senna (SENOKOT) 8.6 MG TABS tablet Take 1 tablet (8.6 mg total) by mouth 2 (two) times daily. 60 each 0   No current facility-administered medications for this visit.    Allergies:  Penicillins; Cephalexin; Penicillin g; Digoxin; and Latex   Social History: The patient  reports that she has never smoked. She has never used smokeless tobacco. She reports that she does not drink alcohol or use drugs.   ROS:  Please see the history of present illness. Otherwise, complete review of systems is positive for chronic back pain.  All other systems are reviewed and negative.   Physical Exam: VS:  BP 115/68   Pulse 62   Ht 5\' 2"  (1.575 m)   Wt 131 lb 9.6 oz (59.7 kg)   SpO2 96%   BMI 24.07 kg/m , BMI Body mass index is 24.07 kg/m.  Wt Readings from Last 3 Encounters:  11/19/17 131 lb 9.6 oz (59.7 kg)  07/13/17 147 lb (66.7 kg)  05/22/17 145 lb (65.8 kg)    General: Elderly woman,  no distress.  Using a cane. HEENT: Conjunctiva and lids normal, oropharynx clear. Neck: Supple, no elevated JVP or carotid bruits, no thyromegaly. Lungs: Clear to auscultation, nonlabored breathing at rest. Cardiac: Regular rate and rhythm, no S3 or significant systolic murmur, no pericardial rub. Abdomen: Soft, nontender, bowel sounds present. Extremities: No pitting edema, distal pulses 1-2+. Skin: Warm and dry. Musculoskeletal: Mild kyphosis. Neuropsychiatric: Alert and oriented x3, affect grossly appropriate.  ECG: I personally reviewed the tracing from 05/22/2017 which showed sinus rhythm with poor R wave progression and left anterior fascicular block.  Recent Labwork: 05/24/2017: BUN 16; Creatinine, Ser 0.71; Hemoglobin  12.0; Platelets 135; Potassium 3.8; Sodium 138   Other Studies Reviewed Today:  Lexiscan Myoview 03/16/2016 [facility]): No myocardial perfusion defects to indicate scar or ischemia, LVEF 69%.  Assessment and Plan:  1.  Paroxysmal atrial fibrillation, symptomatically well controlled on flecainide and Toprol-XL.  She is in sinus rhythm today by follow-up ECG, normal QRS duration with old left anterior fascicular block.  No dizziness or syncope.  As noted at the previous office visit, she is no longer anticoagulated.  2.  Intermittent leg edema, uses Lasix with potassium supplements.  Has been stable.  3.  History of type 2 diabetes mellitus, diet managed and followed by Dr. Leandrew Koyanagi.  Current medicines were reviewed with the patient today.   Orders Placed This Encounter  Procedures  . EKG 12-Lead    Disposition: Follow-up in 6 months.  Signed, Jonelle Sidle, MD, [facility] 11/19/2017 10:29 AM    [facility] at [facility] [address] [place], [place], [place] [address] Phone: [phone]; Fax: [phone]

## 2017-11-19 ENCOUNTER — Encounter: Payer: Self-pay | Admitting: Cardiology

## 2017-11-19 ENCOUNTER — Ambulatory Visit (INDEPENDENT_AMBULATORY_CARE_PROVIDER_SITE_OTHER): Payer: Medicare Other | Admitting: Cardiology

## 2017-11-19 VITALS — BP 115/68 | HR 62 | Ht 62.0 in | Wt 131.6 lb

## 2017-11-19 DIAGNOSIS — I480 Paroxysmal atrial fibrillation: Secondary | ICD-10-CM

## 2017-11-19 DIAGNOSIS — R600 Localized edema: Secondary | ICD-10-CM | POA: Diagnosis not present

## 2017-11-19 DIAGNOSIS — E119 Type 2 diabetes mellitus without complications: Secondary | ICD-10-CM | POA: Diagnosis not present

## 2017-11-19 NOTE — Patient Instructions (Signed)

## 2018-02-26 HISTORY — PX: COLONOSCOPY: SHX174

## 2018-04-29 ENCOUNTER — Encounter: Payer: Self-pay | Admitting: Gastroenterology

## 2018-05-02 ENCOUNTER — Ambulatory Visit (INDEPENDENT_AMBULATORY_CARE_PROVIDER_SITE_OTHER): Payer: Medicare Other | Admitting: Internal Medicine

## 2018-05-09 ENCOUNTER — Ambulatory Visit: Payer: Medicare Other | Admitting: Gastroenterology

## 2018-06-04 ENCOUNTER — Encounter: Payer: Self-pay | Admitting: Cardiology

## 2018-06-04 ENCOUNTER — Ambulatory Visit (INDEPENDENT_AMBULATORY_CARE_PROVIDER_SITE_OTHER): Payer: Medicare Other | Admitting: Cardiology

## 2018-06-04 VITALS — BP 118/80 | HR 74 | Ht 60.0 in | Wt 126.0 lb

## 2018-06-04 DIAGNOSIS — I480 Paroxysmal atrial fibrillation: Secondary | ICD-10-CM | POA: Diagnosis not present

## 2018-06-04 DIAGNOSIS — R600 Localized edema: Secondary | ICD-10-CM | POA: Diagnosis not present

## 2018-06-04 NOTE — Progress Notes (Signed)
Cardiology Office Note  Date: 06/04/2018   ID: [name] FlowBetty F [name], DOB [birth year], MRN [id number]  PCP: Juliette AlcideBurdine, Steven E, MD  Primary Cardiologist: Nona DellSamuel Deundra Furber, MD   Chief Complaint  Patient presents with  . Atrial Fibrillation    History of Present Illness: [name] [name] is an 85 y.o. female last seen in June 2019.  She is here for a routine follow-up visit.  She reports occasional palpitations but no prolonged episodes.  She has had some leg swelling recently, this improved with temporary increase in her Lasix dose by Dr. Leandrew KoyanagiBurdine.  She does not report any dizziness or syncope.  No exertional chest pain.  As per discussion in office note from February 2019 she is no longer anticoagulated.  I reviewed her medications which are outlined below.  She continues on Tambocor and Toprol-XL.  Past Medical History:  Diagnosis Date  . Chronic edema   . History of cardiovascular stress test    Negative dobutamine echo 6/13  . Osteoarthritis   . Paroxysmal atrial fibrillation (HCC)   . PFO (patent foramen ovale)   . Rosacea   . Type 2 diabetes mellitus (HCC)     Past Surgical History:  Procedure Laterality Date  . ANKLE SURGERY Left   . HIP SURGERY Right   . INTRAMEDULLARY (IM) NAIL INTERTROCHANTERIC Left 05/23/2017   Procedure: Percutaneous Pinning of the left hip;  Surgeon: Yolonda Kidaogers, Jason Patrick, MD;  Location: [facility];  Service: Orthopedics;  Laterality: Left;  . PARTIAL HYSTERECTOMY     fibroid tumors   . RECTOCELE REPAIR    . REPLACEMENT TOTAL KNEE BILATERAL      Current Outpatient Medications  Medication Sig Dispense Refill  . CALCIUM-VITAMIN D PO Take 1 tablet by mouth daily.     Marland Kitchen. docusate sodium (COLACE) 100 MG capsule Take 100 mg by mouth daily.    . flecainide (TAMBOCOR) 50 MG tablet Take 1 tablet (50 mg total) by mouth 2 (two) times daily. 180 tablet 0  . furosemide (LASIX) 40 MG tablet Take 40 mg by mouth daily.    . metoprolol succinate (TOPROL XL) 25 MG 24  hr tablet Take 1 tablet (25 mg total) by mouth daily. 90 tablet 1  . Multiple Vitamin (MULTIVITAMIN) tablet Take 1 tablet by mouth daily.      . potassium chloride SA (K-DUR,KLOR-CON) 20 MEQ tablet Take 20 mEq by mouth daily.    Marland Kitchen. senna (SENOKOT) 8.6 MG TABS tablet Take 1 tablet (8.6 mg total) by mouth 2 (two) times daily. 60 each 0   No current facility-administered medications for this visit.    Allergies:  Penicillins; Cephalexin; Penicillin g; Digoxin; and Latex   Social History: The patient  reports that she has never smoked. She has never used smokeless tobacco. She reports that she does not drink alcohol or use drugs.   ROS:  Please see the history of present illness. Otherwise, complete review of systems is positive for hearing loss, gait instability.  All other systems are reviewed and negative.   Physical Exam: VS:  BP 118/80   Pulse 74   Ht 5' (1.524 m)   Wt 126 lb (57.2 kg)   SpO2 96%   BMI 24.61 kg/m , BMI Body mass index is 24.61 kg/m.  Wt Readings from Last 3 Encounters:  06/04/18 126 lb (57.2 kg)  11/19/17 131 lb 9.6 oz (59.7 kg)  07/13/17 147 lb (66.7 kg)    General: Elderly woman, appears comfortable at rest.  HEENT: Conjunctiva and lids normal, oropharynx clear. Neck: Supple, no elevated JVP or carotid bruits, no thyromegaly. Lungs: Clear to auscultation, nonlabored breathing at rest. Cardiac: Regular rate and rhythm with occasional ectopy, no S3 or significant systolic murmur. Abdomen: Soft, nontender, bowel sounds present. Extremities: Trace ankle edema, distal pulses 2+.  ECG: I personally reviewed the tracing from 11/19/2017 which showed sinus bradycardia with left anterior fascicular block, QRS 92 ms, nonspecific T wave changes.  Recent Labwork:  05/24/2017: BUN 16; Creatinine, Ser 0.71; Hemoglobin 12.0; Platelets 135; Potassium 3.8; Sodium 138   Other Studies Reviewed Today:  Lexiscan Myoview 03/16/2016 [facility]): No myocardial perfusion defects to  indicate scar or ischemia, LVEF 69%.  Assessment and Plan:  1.  Paroxysmal atrial fibrillation.  Continue Toprol-XL and flecainide.  She declines anticoagulation as discussed in the previous note from February 2019.  2.  Intermittent leg edema, continue Lasix with potassium supplements.  Current medicines were reviewed with the patient today.  Disposition: Follow-up in 6 months.  Signed, Jonelle SidleSamuel G. Momoko Slezak, MD, [facility] 06/04/2018 3:25 PM    [facility] [address], [place], [place] [address] Phone: [phone](336) [phone]; Fax: [phone](336) [phone]

## 2018-06-04 NOTE — Patient Instructions (Addendum)

## 2018-06-09 IMAGING — CR DG HIP (WITH OR WITHOUT PELVIS) 2-3V*L*
3 series · 3 of 3 positions shown · non-contrast
Comparison: None.

CLINICAL DATA: Status post fall, pain

EXAM:
DG HIP (WITH OR WITHOUT PELVIS) 2-3V LEFT

[x pelvis]
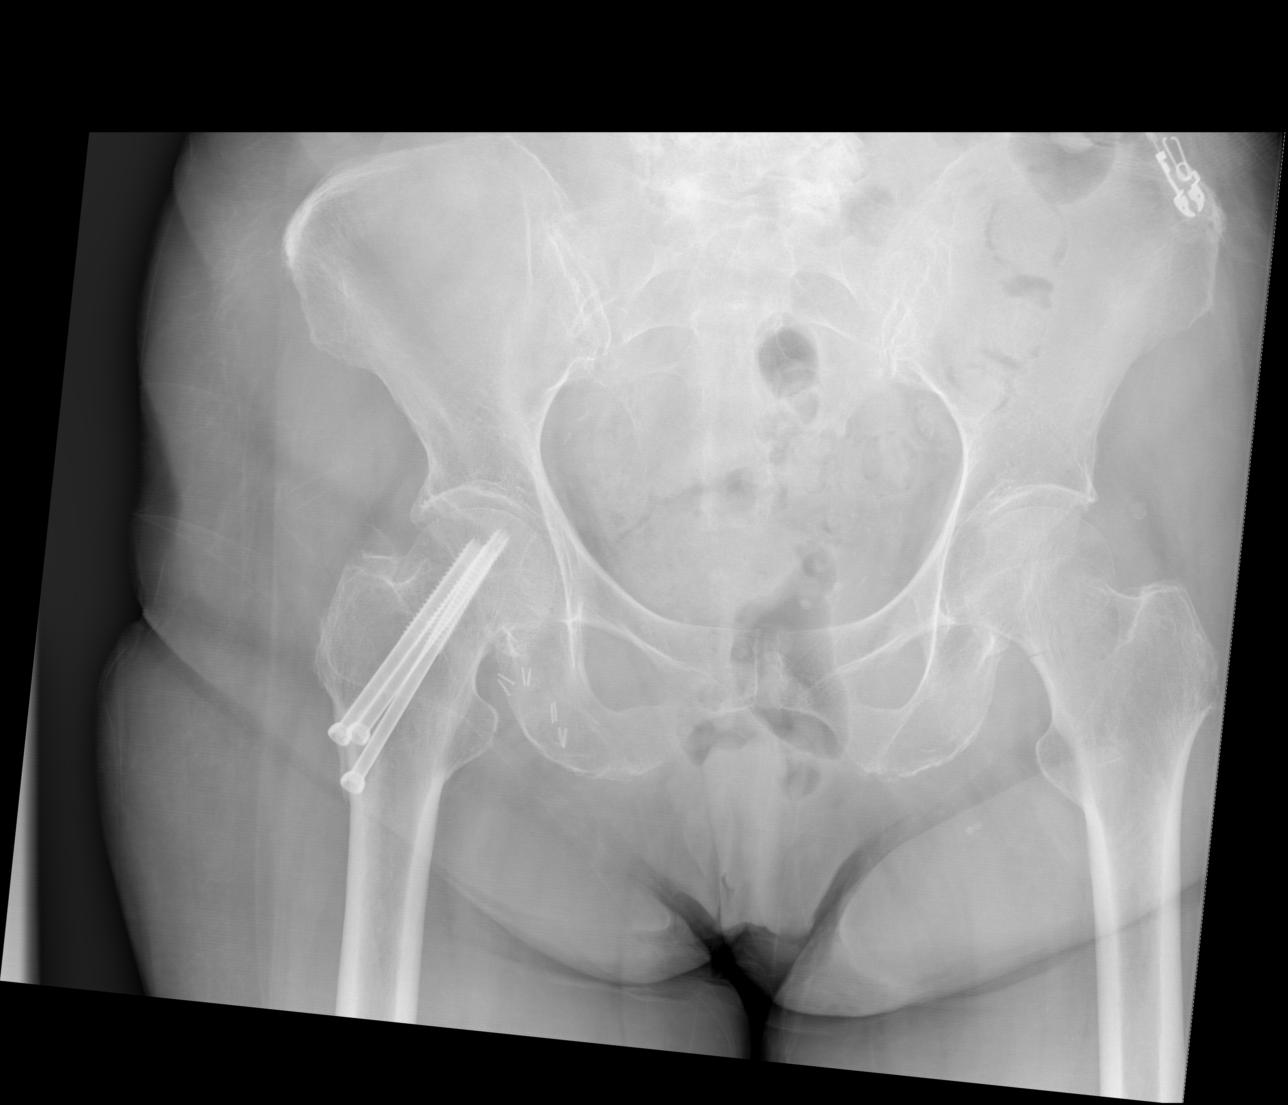

[x hip ap left]
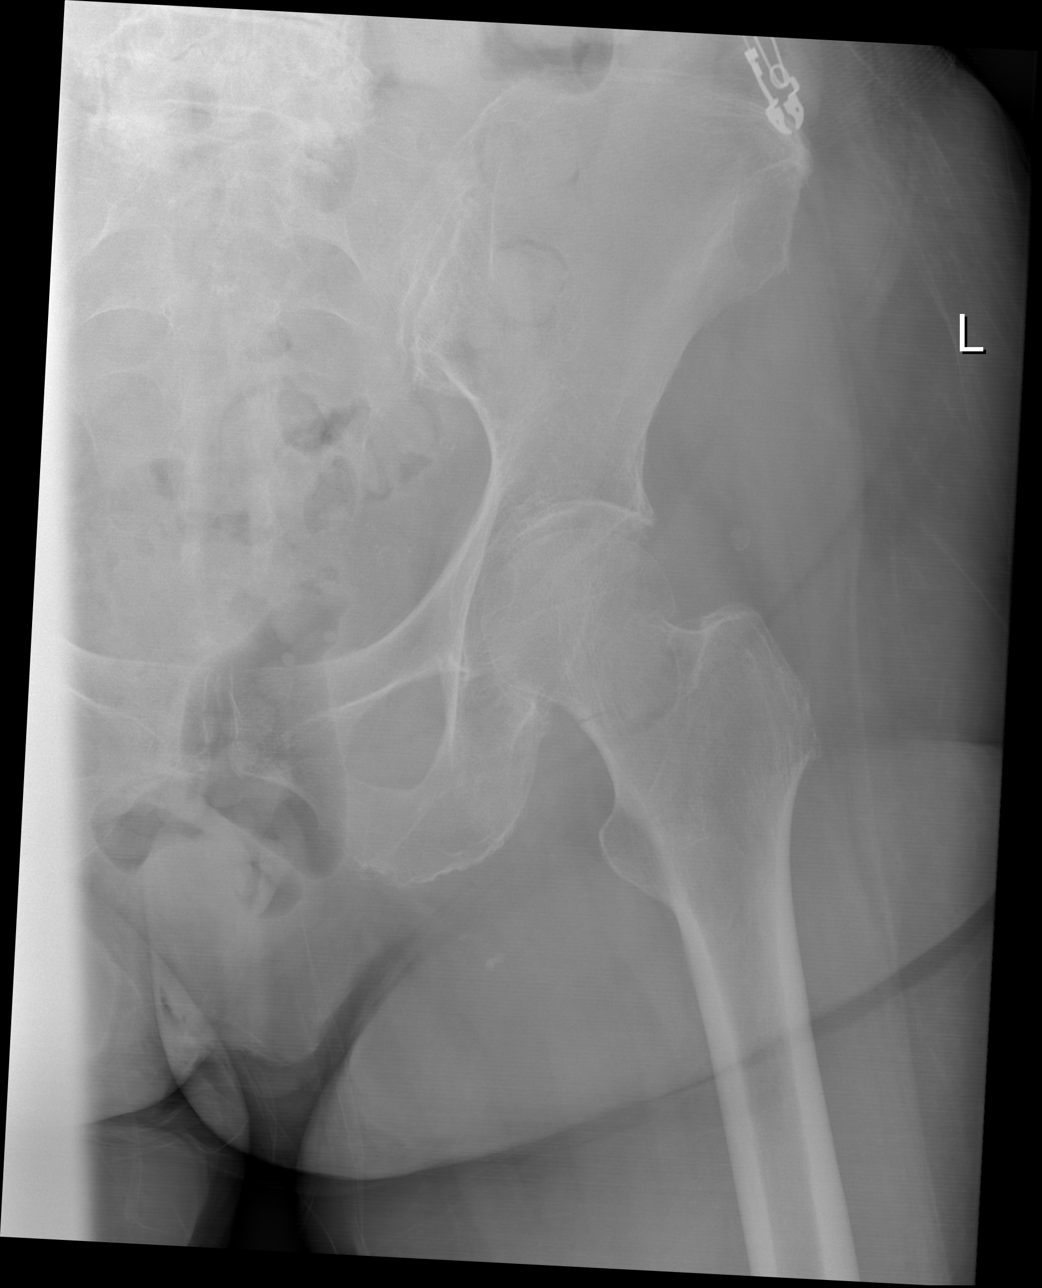

[w hip lat left]
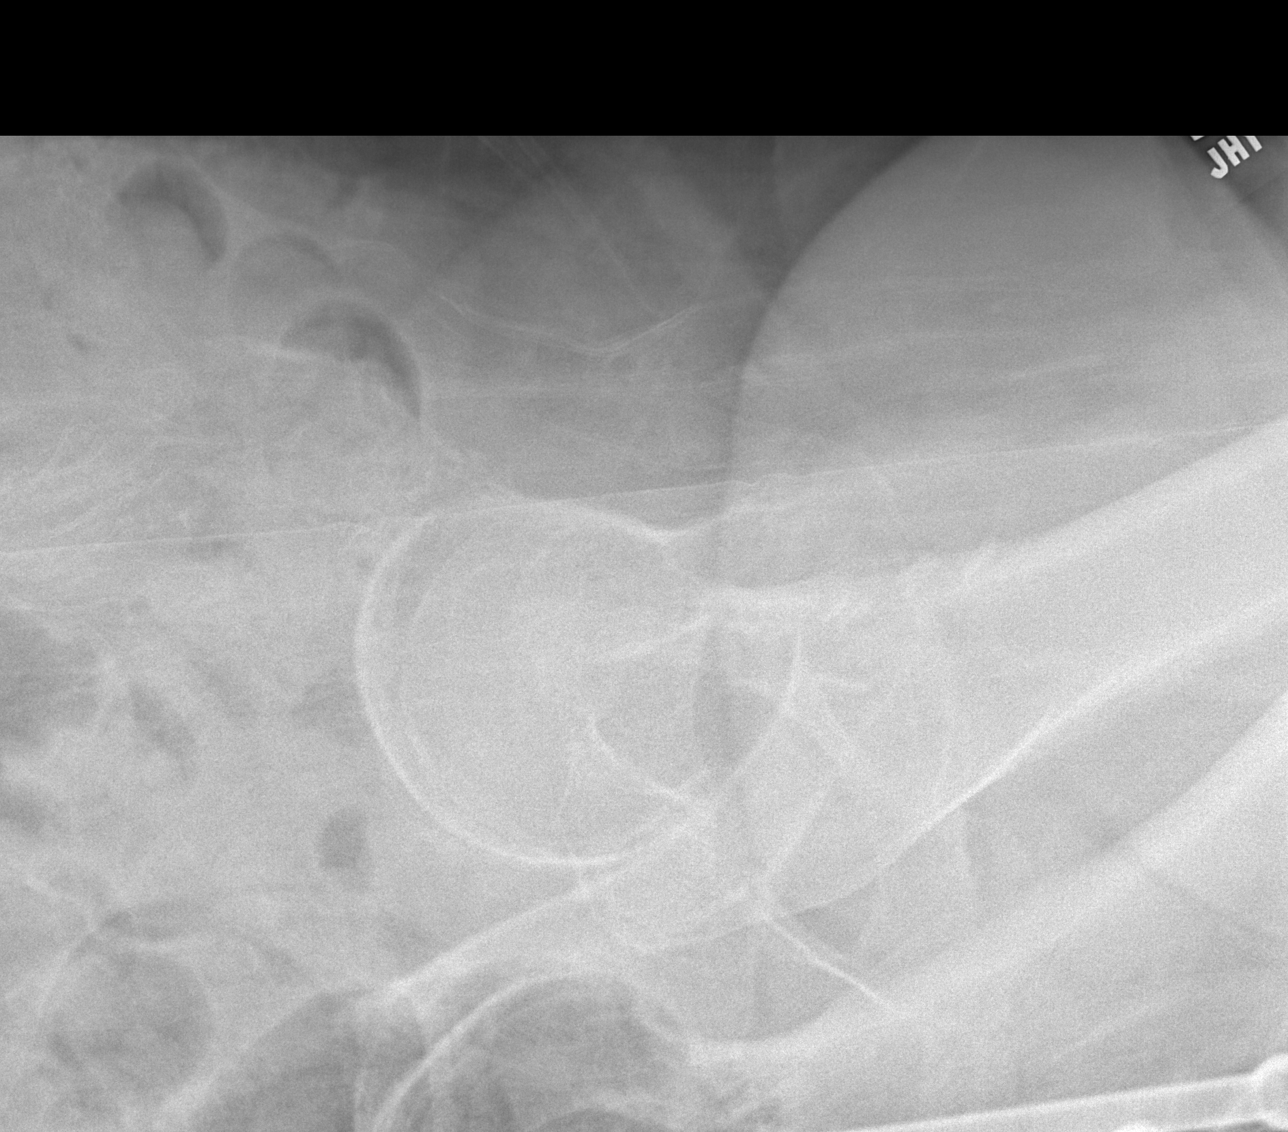

[3 of 3 positions shown; findings below may reference images not displayed]

FINDINGS: Nondisplaced left subcapital femoral neck fracture. No other
fracture or dislocation. Generalized osteopenia.

Prior ORIF of a right femoral neck fracture.

Lower lumbar spine spondylosis.
IMPRESSION: Nondisplaced left subcapital femoral neck fracture.

## 2018-06-09 IMAGING — CT CT HIP*L* W/O CM
2 of 5 series · 15 of 46 positions shown, 19 images · non-contrast
Comparison: Left hip radiograph dated 05/22/2017

CLINICAL DATA: 89-year-old female with left hip fracture.

EXAM:
CT OF THE LEFT HIP WITHOUT CONTRAST
TECHNIQUE: Multidetector CT imaging of the left hip was performed according to
the standard protocol. Multiplanar CT image reconstructions were
also generated.

[Series 3: axial st · axial · 0.40mm/px · z∈[+908,+1092]mm · 13 of 103 slices shown, 16 images]
[im 7/103  soft-tissue]
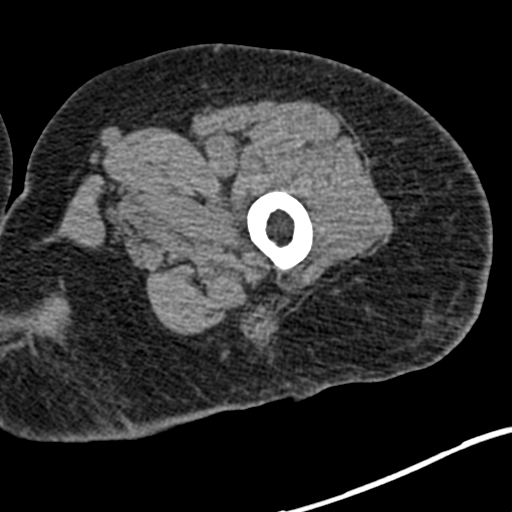
[im 7/103  bone]
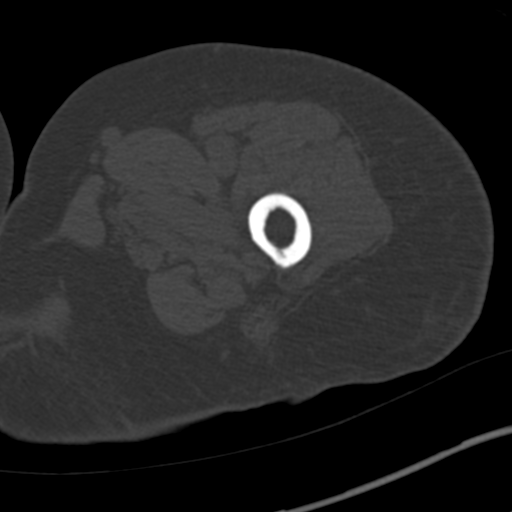
[im 17/103  soft-tissue]
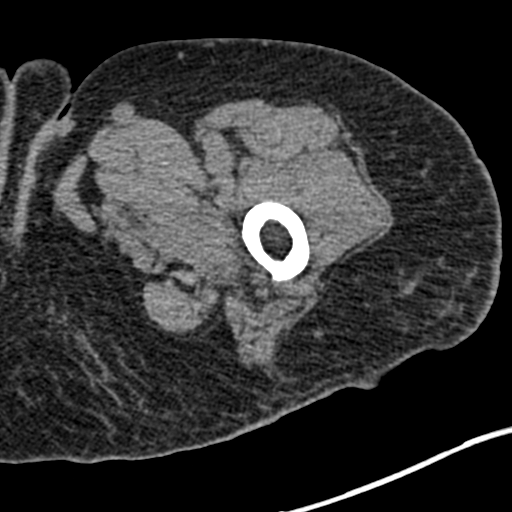
[im 27/103  soft-tissue]
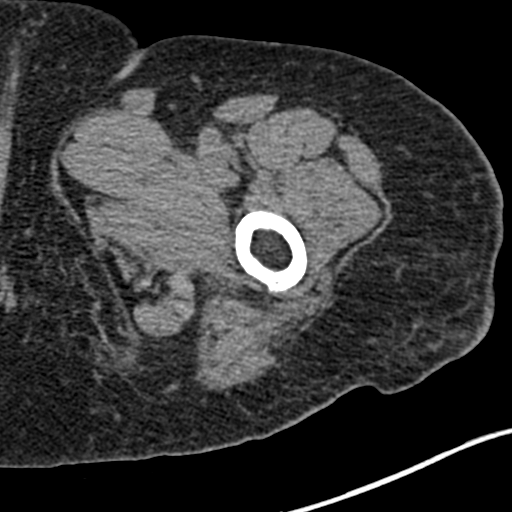
[im 37/103  soft-tissue]
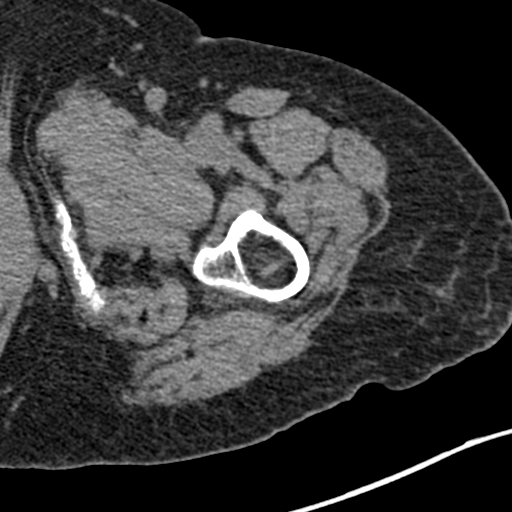
[im 47/103  soft-tissue]
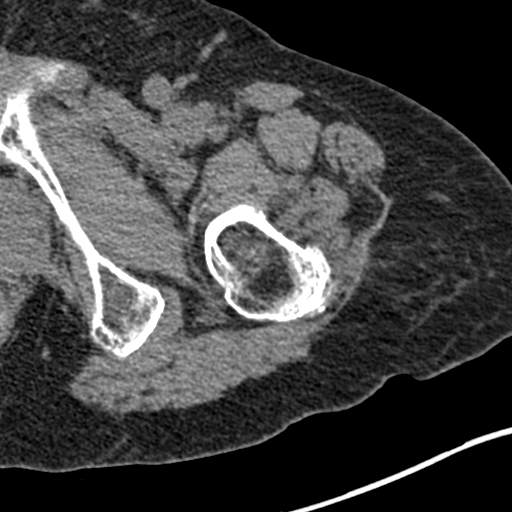
[im 56/103  soft-tissue]
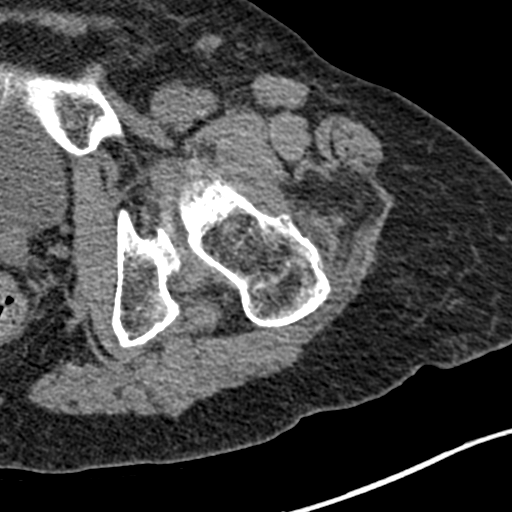
[im 66/103  soft-tissue]
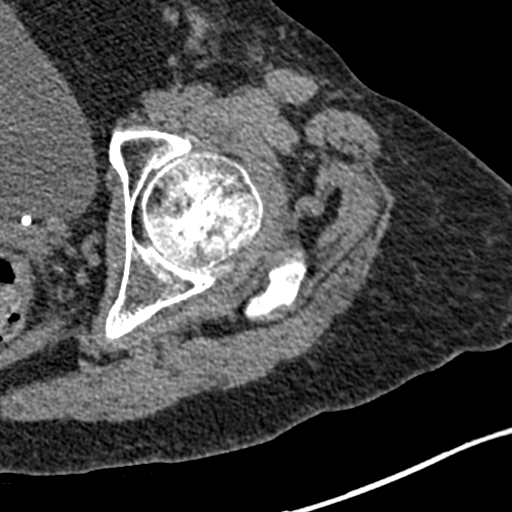
[im 76/103  soft-tissue]
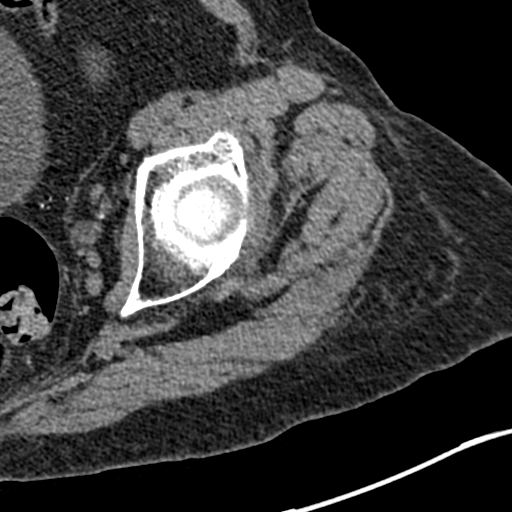
[im 86/103  soft-tissue]
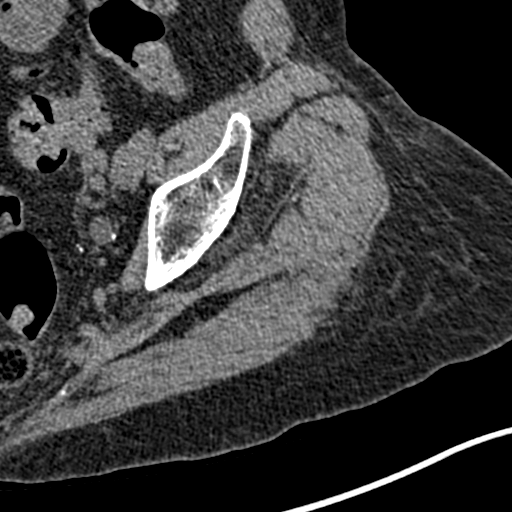
[im 86/103  bone]
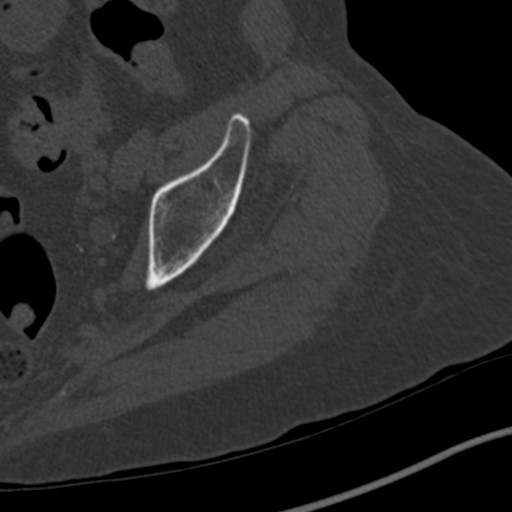
[im 89/103  lung]
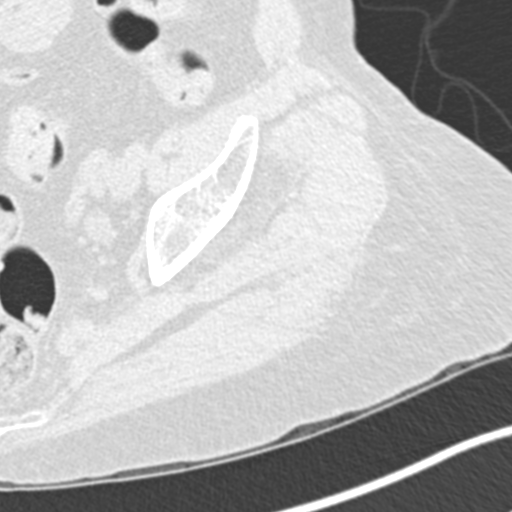
[im 93/103  lung]
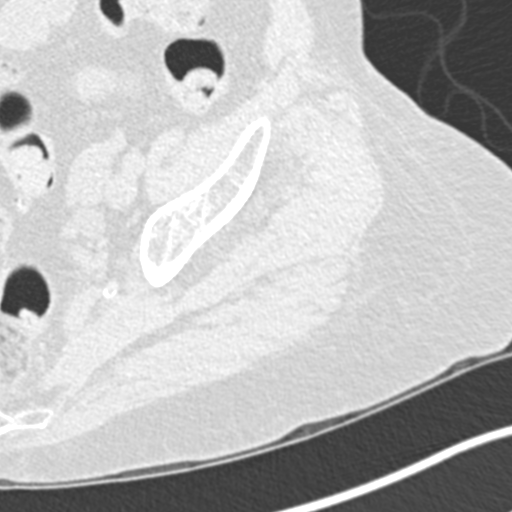
[im 96/103  soft-tissue]
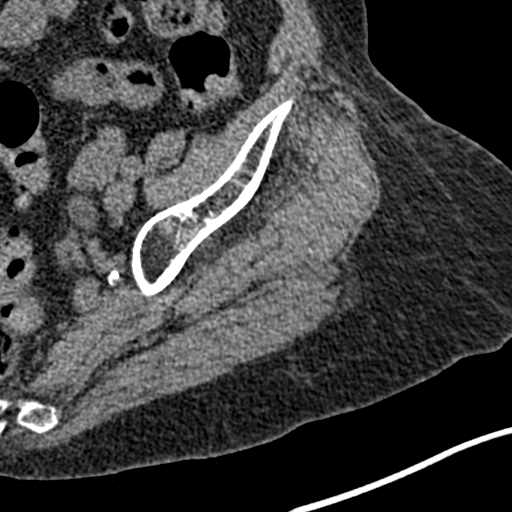
[im 96/103  lung]
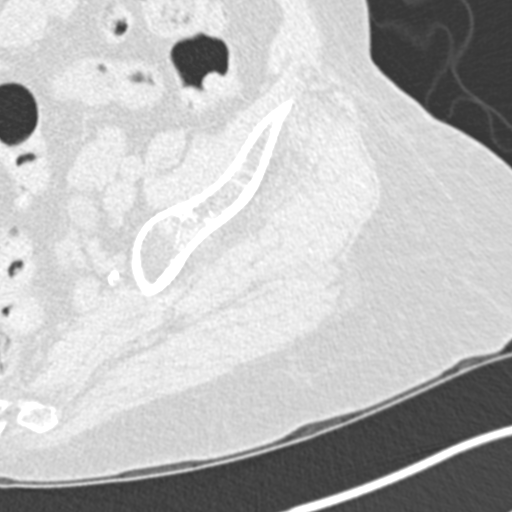
[im 99/103  lung]
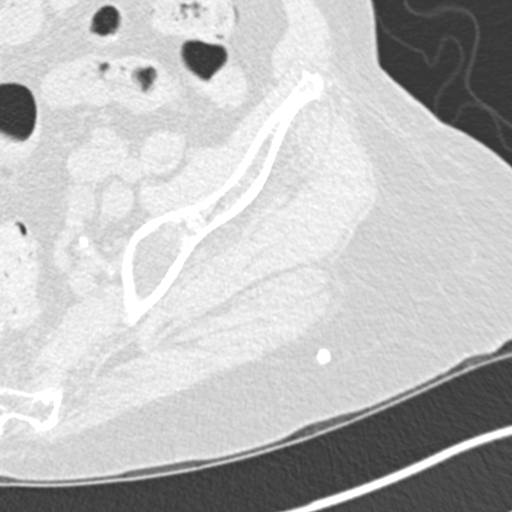

[Series 10: coronal st tru · coronal · 0.40mm/px · 2 of 90 slices shown, 3 images]
[im 30/90  soft-tissue]
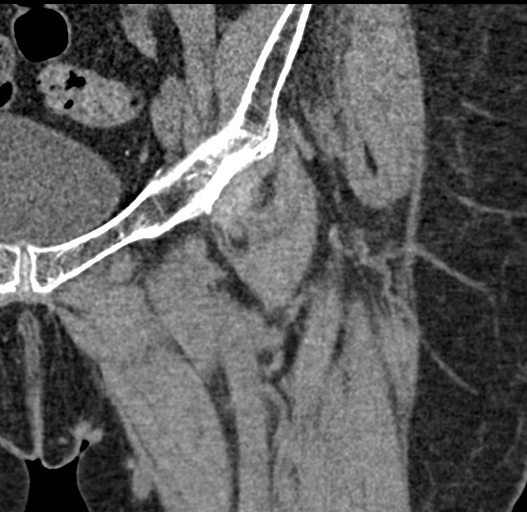
[im 30/90  bone]
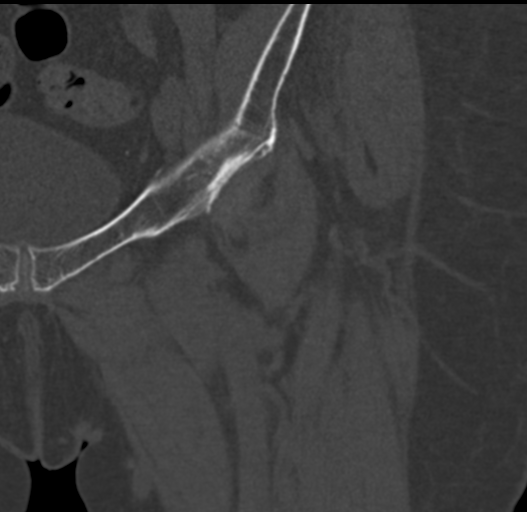
[im 60/90  soft-tissue]
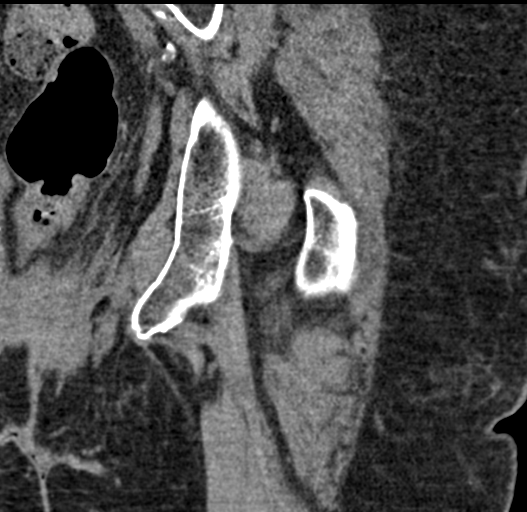

[15 of 46 positions shown; findings below may reference images not displayed]

FINDINGS: Bones/Joint/Cartilage

Evaluation for fracture is limited due to advanced osteopenia. There
is a nondisplaced fracture of the left femoral neck. There is no
dislocation.

Ligaments

Suboptimally assessed by CT.

Muscles and Tendons

No acute findings.  No intramuscular hematoma.

Soft tissues

Sigmoid diverticulosis. The soft tissues are otherwise unremarkable.
IMPRESSION: Nondisplaced left femoral neck fracture.

## 2018-06-09 IMAGING — DX DG CHEST 1V PORT
1 series · 1 of 1 positions shown · non-contrast
Comparison: None.

CLINICAL DATA: Status post fall, left hip fracture

EXAM:
PORTABLE CHEST 1 VIEW

[chest ap]
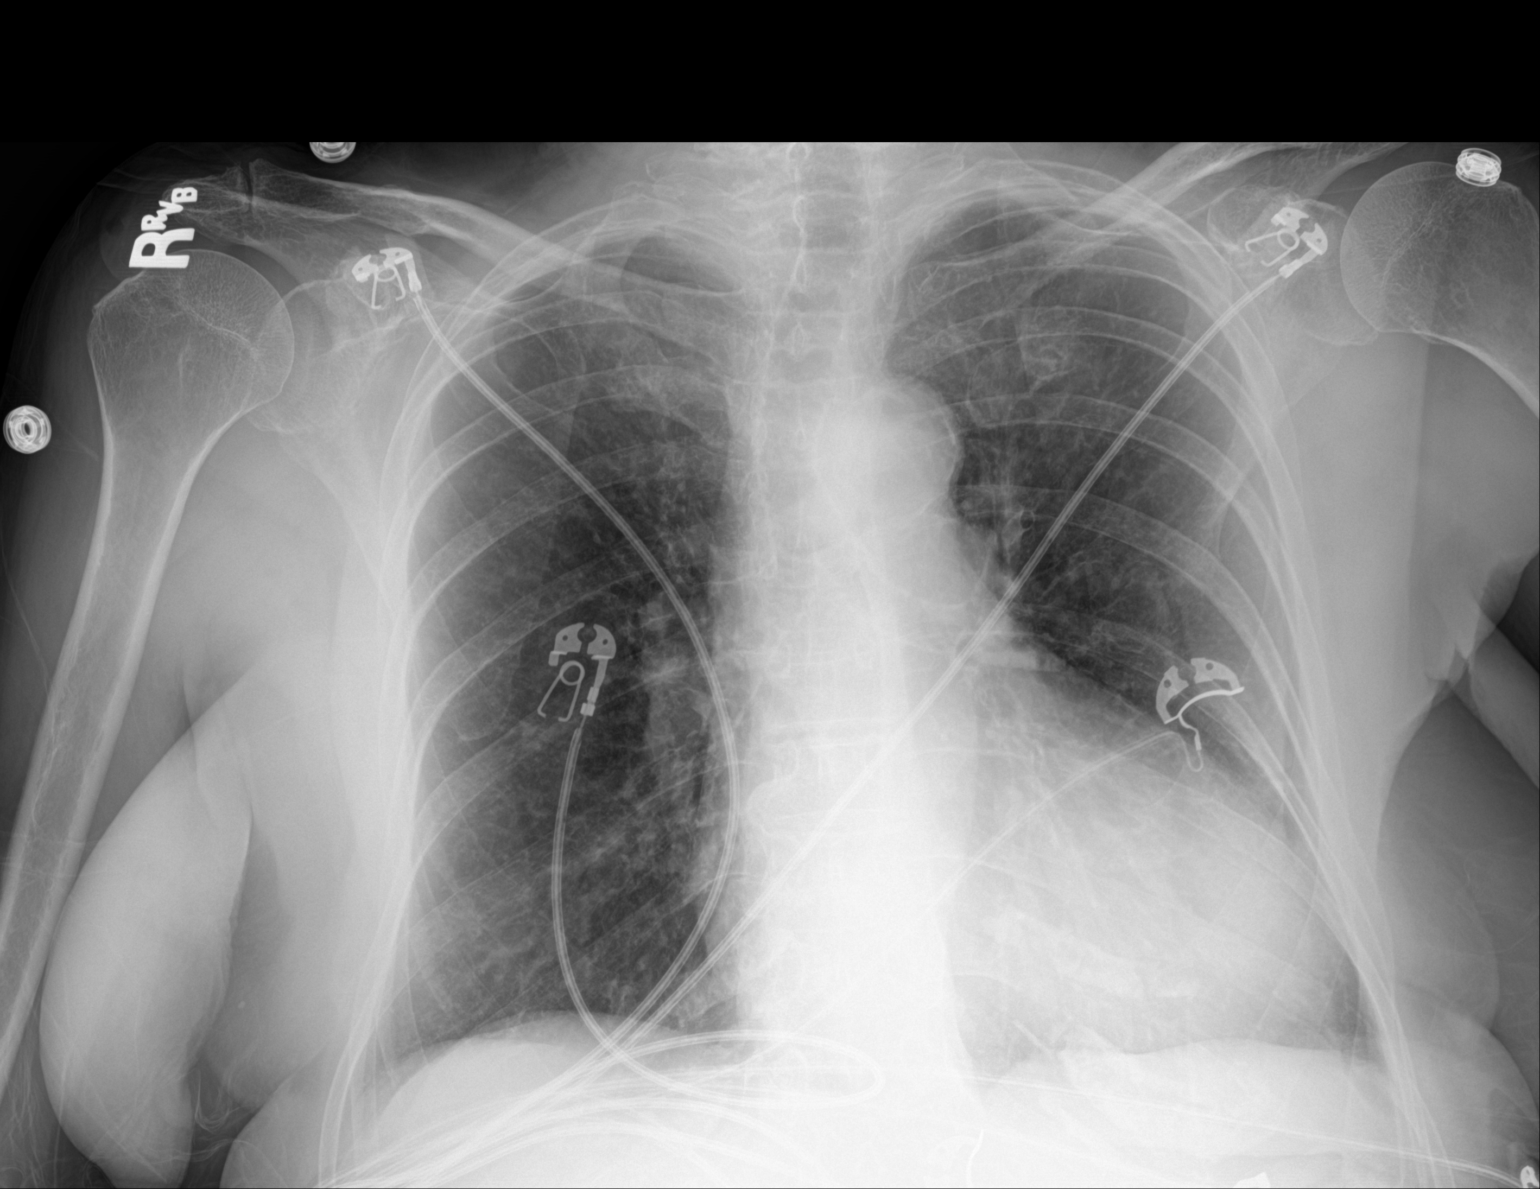

[1 of 1 positions shown; findings below may reference images not displayed]

FINDINGS: There is no focal parenchymal opacity. There is no pleural effusion
or pneumothorax. There is stable cardiomegaly. There is thoracic
aortic atherosclerosis.

The osseous structures are unremarkable.
IMPRESSION: No active disease.

## 2018-06-10 IMAGING — RF DG HIP (WITH PELVIS) OPERATIVE*L*
1 series · 3 of 3 positions shown · non-contrast
Comparison: Preoperative study May 22, 2017

CLINICAL DATA: Intraoperative fixation of a subcapital femoral neck
fracture.

EXAM:
OPERATIVE left HIP (WITH PELVIS IF PERFORMED) 3 VIEWS
TECHNIQUE: Fluoroscopic spot image(s) were submitted for interpretation
post-operatively.

[Series 1: run · 3 of 3 slices shown]
[im 1/3]
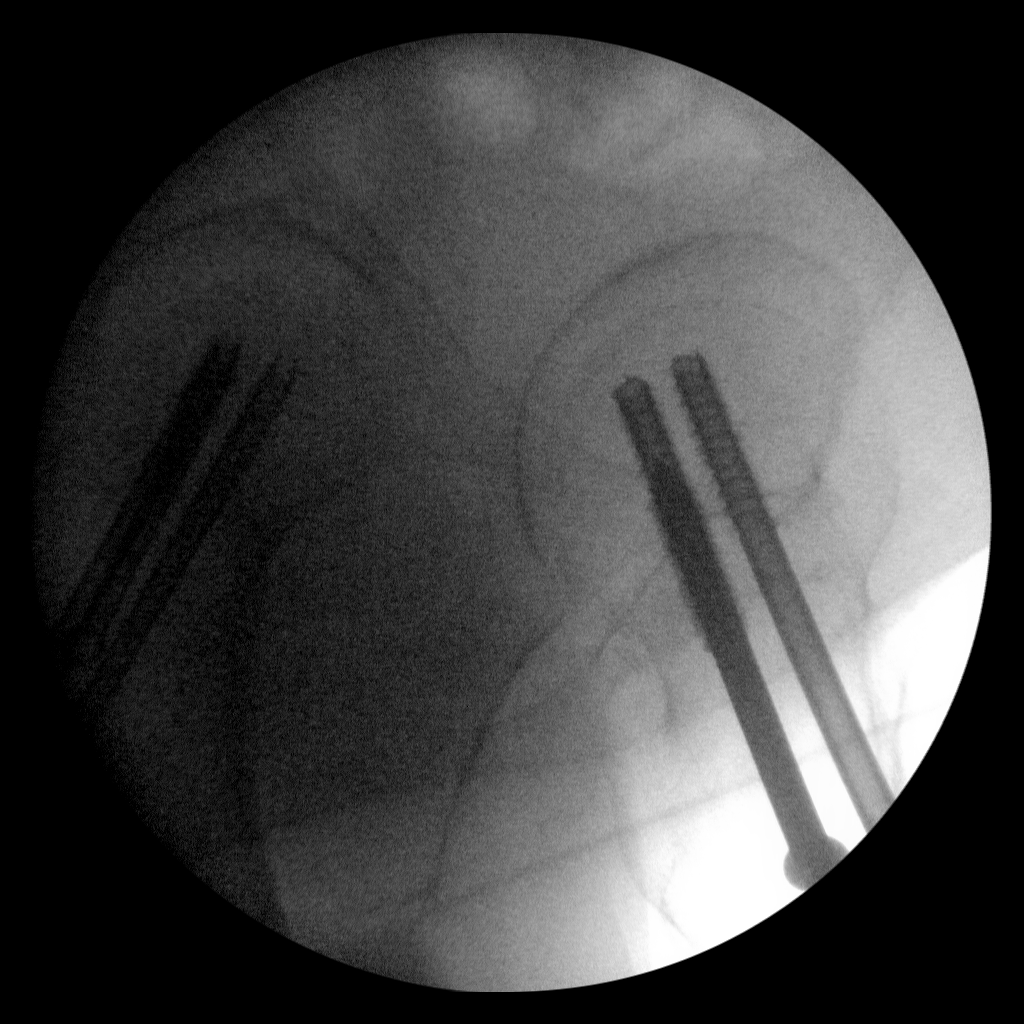
[im 2/3]
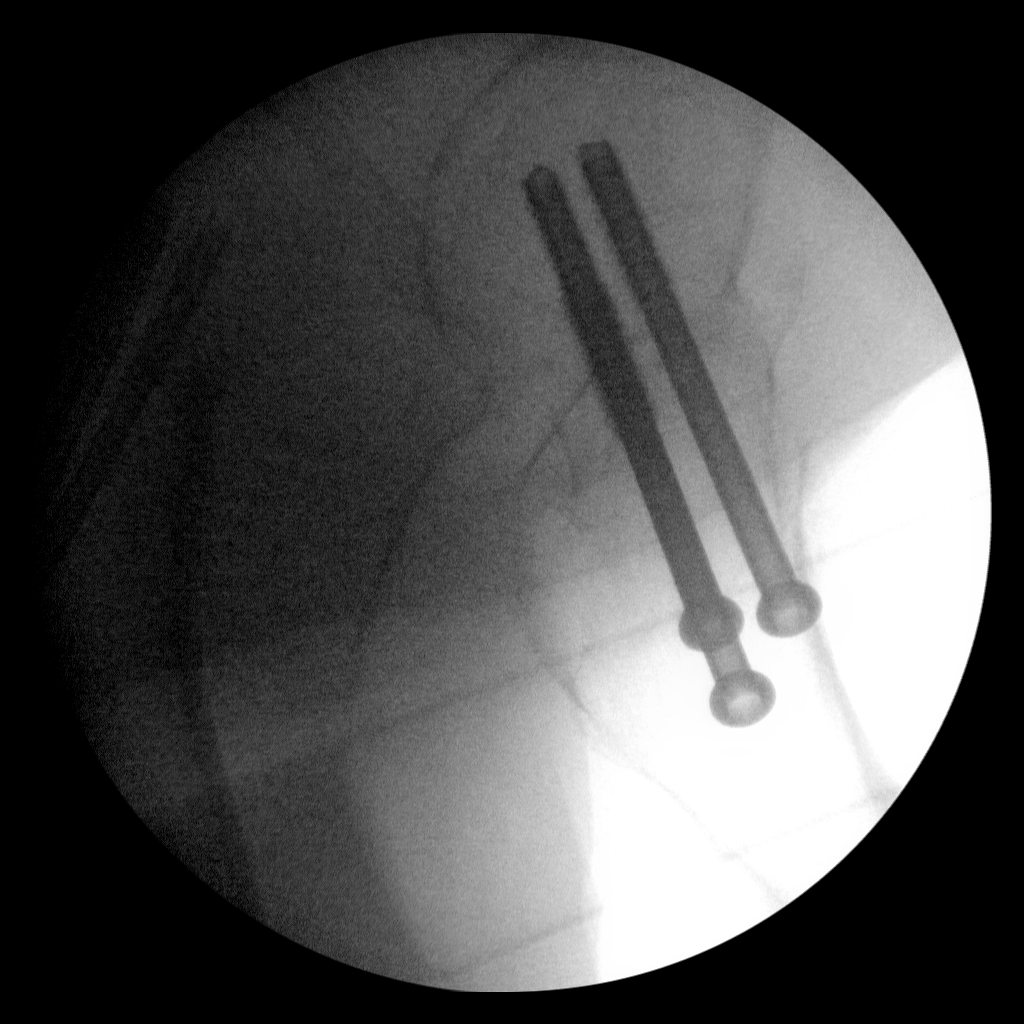
[im 3/3]
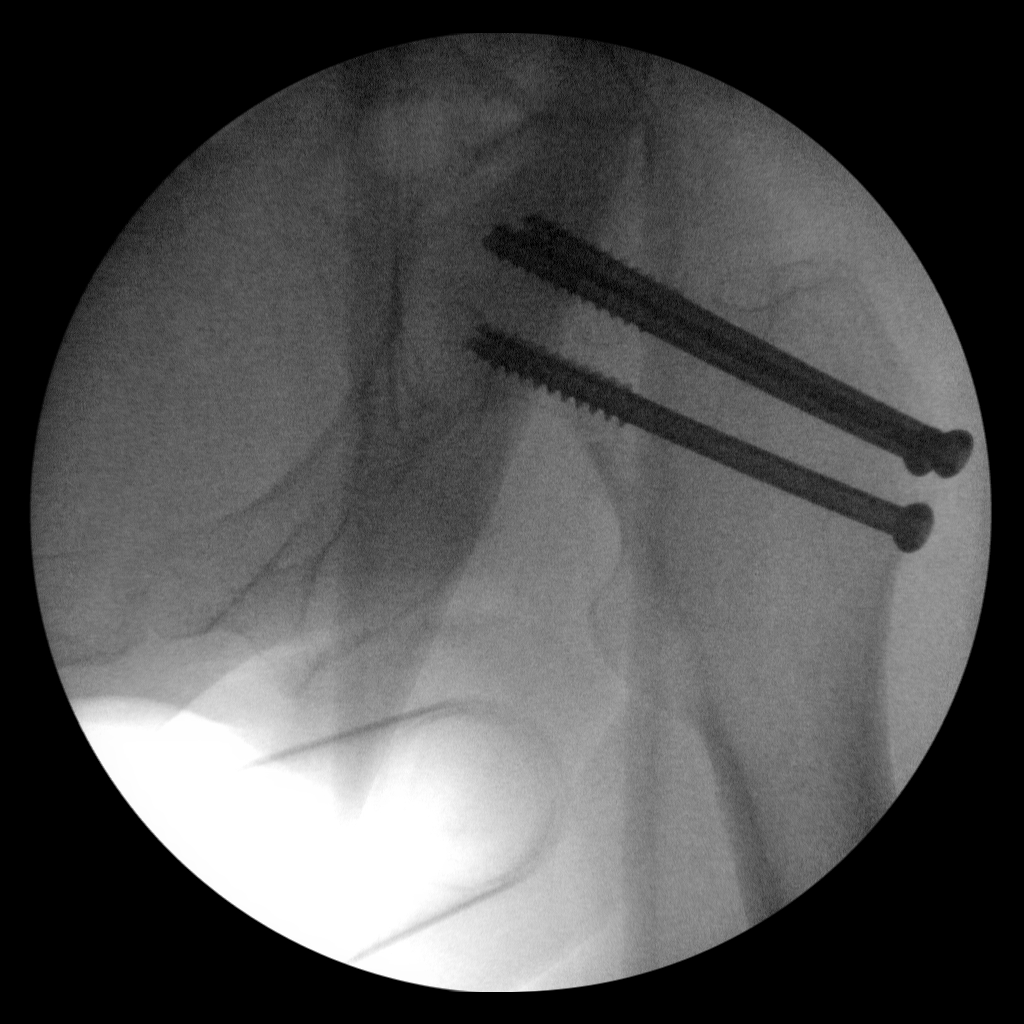

[3 of 3 positions shown; findings below may reference images not displayed]

FINDINGS: Fluoro time reported is 48 seconds. 3 fluoro spot images reveal
placement of 3 cortical screws traversing the intertrochanteric
region and femoral neck and entering the femoral head for fixation
of the left femoral neck fracture. There is no immediate
postprocedure complication.
IMPRESSION: The patient has undergone successful ORIF for a femoral neck
fracture.

## 2018-06-20 ENCOUNTER — Other Ambulatory Visit: Payer: Self-pay | Admitting: Cardiology

## 2018-07-26 ENCOUNTER — Encounter: Payer: Self-pay | Admitting: Gastroenterology

## 2018-07-26 ENCOUNTER — Ambulatory Visit (INDEPENDENT_AMBULATORY_CARE_PROVIDER_SITE_OTHER): Payer: Medicare Other | Admitting: Gastroenterology

## 2018-07-26 DIAGNOSIS — K5900 Constipation, unspecified: Secondary | ICD-10-CM | POA: Insufficient documentation

## 2018-07-26 NOTE — Assessment & Plan Note (Signed)
Very pleasant 85 year old female with chronic constipation, wanting to stay with over-the-counter options instead of prescription. She has had a recent colonoscopy by Dr. Gabriel Cirri with adenomas. Likely element of pelvic floor dysfunction.  Start Benefiber daily (she desires to take in evening) Senokot 2 daily each morning (which has worked well in past for patient) Miralax each morning Call with update. Would recommend Amitiza in future, but she is wanting to avoid prescriptions currently Return in 2 months for close follow-up

## 2018-07-26 NOTE — Progress Notes (Signed)
Primary Care Physician:  Juliette Alcide, MD Primary Gastroenterologist:  Dr. Darrick Penna   Chief Complaint  Patient presents with  . Constipation    HPI:   [name] WOOLLARD is a 85 y.o. female presenting today at the request of Dr. Leandrew Koyanagi due to constipation.   May 22, 2017 broke her hip. Developed a hematoma and was sent to [facility]. Had 4 blood transfusions. From that point has gone downhill. Broke her right hip 7 years ago. Lives alone. Sister lives in [place] and son lives in [place], [place], works with the [employer]. Takes Norco a few times a week. Has had lifelong history of constipation. Has history of rectocele repair. Now has trouble passing the stool.   Senokot once daily, Miralax daily. Suppository daily. Feels pressure and has to strain. Sometimes stool is hard. BM once to twice a day. Usually just once. To get it started, has to use a suppository. Had tried Linzess in the past but was 30$ for 30 pills. Fiber hasn't agreed with her in the past but wasn't sure which brand. Has lower abdominal discomfort but wonders if it comes from her back; however, she notices improvement after BM that is productive. Receives injections for back and uses cane. She wants to use OTC agents for now instead of prescription.   Husband passed away 11 years ago. Dr. Gabriel Cirri Fall 2019: adenomas transverse colon.  Past Medical History:  Diagnosis Date  . Chronic edema   . History of cardiovascular stress test    Negative dobutamine echo 6/13  . Osteoarthritis   . Paroxysmal atrial fibrillation (HCC)   . PFO (patent foramen ovale)   . Rosacea     Past Surgical History:  Procedure Laterality Date  . ANKLE SURGERY Left   . COLONOSCOPY  02/2018   Dr. Gabriel Cirri: adenomas  . HIP SURGERY Right   . INTRAMEDULLARY (IM) NAIL INTERTROCHANTERIC Left 05/23/2017   Procedure: Percutaneous Pinning of the left hip;  Surgeon: Yolonda Kida, MD;  Location: [facility];  Service: Orthopedics;  Laterality: Left;  .  PARTIAL HYSTERECTOMY     fibroid tumors   . RECTOCELE REPAIR    . REPLACEMENT TOTAL KNEE Right    X 3    Current Outpatient Medications  Medication Sig Dispense Refill  . CALCIUM-VITAMIN D PO Take 1 tablet by mouth daily.     . flecainide (TAMBOCOR) 50 MG tablet Take 1 tablet (50 mg total) by mouth 2 (two) times daily. 180 tablet 0  . furosemide (LASIX) 40 MG tablet Take 40 mg by mouth daily.    Marland Kitchen HYDROcodone-acetaminophen (NORCO) 10-325 MG tablet Take 1 tablet by mouth as needed.    . metoprolol succinate (TOPROL-XL) 25 MG 24 hr tablet TAKE 1 TABLET BY MOUTH DAILY 90 tablet 1  . metroNIDAZOLE (METROCREAM) 0.75 % cream Apply 1 application topically daily.    . Multiple Vitamin (MULTIVITAMIN) tablet Take 1 tablet by mouth daily.      . naproxen sodium (ALEVE) 220 MG tablet Take 220 mg by mouth as needed.    . polyethylene glycol (MIRALAX / GLYCOLAX) packet Take 17 g by mouth daily.    . potassium chloride SA (K-DUR,KLOR-CON) 20 MEQ tablet Take 20 mEq by mouth daily.    . sennosides-docusate sodium (SENOKOT-S) 8.6-50 MG tablet Take 1 tablet by mouth daily.     No current facility-administered medications for this visit.     Allergies as of 07/26/2018 - Review Complete 07/26/2018  Allergen  Reaction Noted  . Penicillins Hives 02/14/2013  . Cephalexin Nausea And Vomiting 04/28/2008  . Penicillin g Dermatitis and Rash 06/05/2017  . Digoxin  07/14/2009  . Latex  05/23/2017    Family History  Problem Relation Age of Onset  . Heart attack Mother   . Other Father        blood poisioning  . Heart attack Maternal Grandmother   . Colon cancer Neg Hx   . Colon polyps Neg Hx     Social History   Socioeconomic History  . Marital status: Widowed    Spouse name: Not on file  . Number of children: Not on file  . Years of education: Not on file  . Highest education level: Not on file  Occupational History  . Not on file  Social Needs  . Financial resource strain: Not on file  .  Food insecurity:    Worry: Not on file    Inability: Not on file  . Transportation needs:    Medical: Not on file    Non-medical: Not on file  Tobacco Use  . Smoking status: Never Smoker  . Smokeless tobacco: Never Used  . Tobacco comment: does not smoke.   Substance and Sexual Activity  . Alcohol use: No  . Drug use: No  . Sexual activity: Not Currently  Lifestyle  . Physical activity:    Days per week: Not on file    Minutes per session: Not on file  . Stress: Not on file  Relationships  . Social connections:    Talks on phone: Not on file    Gets together: Not on file    Attends religious service: Not on file    Active member of club or organization: Not on file    Attends meetings of clubs or organizations: Not on file    Relationship status: Not on file  . Intimate partner violence:    Fear of current or ex partner: Not on file    Emotionally abused: Not on file    Physically abused: Not on file    Forced sexual activity: Not on file  Other Topics Concern  . Not on file  Social History Narrative   Widowed.    (medical history: coumadin therapy ; follow-up by Dr. Sherril Croon )    Review of Systems: Gen: Denies any fever, chills, fatigue, weight loss, lack of appetite.  CV: Denies chest pain, heart palpitations, peripheral edema, syncope.  Resp: Denies shortness of breath at rest or with exertion. Denies wheezing or cough.  GI: see HPI GU : Denies urinary burning, urinary frequency, urinary hesitancy MS: Denies joint pain, muscle weakness, cramps, or limitation of movement.  Derm: Denies rash, itching, dry skin Psych: Denies depression, anxiety, memory loss, and confusion Heme: Denies bruising, bleeding, and enlarged lymph nodes.  Physical Exam: BP (!) 154/71   Pulse 63   Temp (!) 96.4 F (35.8 C) (Oral)   Ht 5' (1.524 m)   Wt 125 lb 9.6 oz (57 kg)   BMI 24.53 kg/m  General:   Alert and oriented. Pleasant and cooperative. Well-nourished and well-developed.    Head:  Normocephalic and atraumatic. Eyes:  Without icterus, sclera clear and conjunctiva pink.  Ears:  Normal auditory acuity. Nose:  No deformity, discharge,  or lesions. Mouth:  No deformity or lesions, oral mucosa pink.  Lungs:  Clear to auscultation bilaterally.  Heart:  S1, S2 present without murmurs appreciated.  Abdomen:  +BS, soft, non-tender and non-distended. Limited  with patient in chair. Unable to get on exam table. Rectal:  Deferred  Msk:  Kyphosis  Extremities:  With 1+ pitting edema lower extremities, right ankle/pedal edema greater than left, chronic per patient Neurologic:  Alert and  oriented x4 Psych:  Alert and cooperative. Normal mood and affect.

## 2018-07-26 NOTE — Patient Instructions (Signed)
Please increase Senokot to 2 tablets each morning.  Take 1 capful of Miralax each morning.  In the evening, take 2 teaspoons of benefiber. I have included a handout. If you start having troublesome gas or bloating, then you can stop this.  I would like to see you back in 2 months!  Please call with how you are doing in about a week.  It was a pleasure to see you today. I strive to create trusting relationships with patients to provide genuine, compassionate, and quality care. I value your feedback. If you receive a survey regarding your visit,  I greatly appreciate you taking time to fill this out.   Gelene Mink, PhD, ANP-BC [facility]

## 2018-07-29 NOTE — Progress Notes (Signed)
CC'D TO PCP °

## 2018-07-30 ENCOUNTER — Other Ambulatory Visit: Payer: Self-pay | Admitting: Cardiology

## 2018-10-10 ENCOUNTER — Encounter: Payer: Self-pay | Admitting: Gastroenterology

## 2018-10-10 ENCOUNTER — Other Ambulatory Visit: Payer: Self-pay

## 2018-10-10 ENCOUNTER — Ambulatory Visit (INDEPENDENT_AMBULATORY_CARE_PROVIDER_SITE_OTHER): Payer: Medicare Other | Admitting: Gastroenterology

## 2018-10-10 DIAGNOSIS — K5900 Constipation, unspecified: Secondary | ICD-10-CM

## 2018-10-10 NOTE — Patient Instructions (Signed)
  This is a picture of an example of generic Senokot available at [employer], which will hopefully be less expensive than the name brand!!  Continue Benefiber in the evening. Continue Miralax each morning. I would add back in Senokot, as this has worked well for you in the past.   We will see you in 6 months or sooner if needed!  I enjoyed talking with you again today! As you know, I value our relationship and want to provide genuine, compassionate, and quality care. I welcome your feedback. If you receive a survey regarding your visit,  I greatly appreciate you taking time to fill this out. See you next time!  Gelene Mink, PhD, ANP-BC [facility]

## 2018-10-10 NOTE — Progress Notes (Signed)
Primary Care Physician:  Juliette Alcide, MD  Primary GI: Dr. Darrick Penna   Virtual Visit via Telephone Note Due to COVID-19, visit is conducted virtually and was requested by patient.   I connected with [name] [name] on 10/10/18 at 10:30 AM EDT by telephone and verified that I am speaking with the correct person using two identifiers.   I discussed the limitations, risks, security and privacy concerns of performing an evaluation and management service by telephone and the availability of in person appointments. I also discussed with the patient that there may be a patient responsible charge related to this service. The patient expressed understanding and agreed to proceed.  Chief Complaint  Patient presents with  . Constipation     History of Present Illness: Pleasant 85 year old female with history of chronic constipation, wanting to stay with over-the-counter options instead of prescription historically. She has had a recent colonoscopy by Dr. Gabriel Cirri with adenomas. Likely element of pelvic floor dysfunction.  Senokot about 25$ for 30 days. Hasn't been able to get due to cost. Miralax each morning. Benefiber in the evening, but it causes gas but not bothersome. She is taking 2 teaspoons a day. BM is about once per day. At times feels like she still needs to go. Still straining at times.   Past Medical History:  Diagnosis Date  . Chronic edema   . History of cardiovascular stress test    Negative dobutamine echo 6/13  . Osteoarthritis   . Paroxysmal atrial fibrillation (HCC)   . PFO (patent foramen ovale)   . Rosacea     Past Surgical History:  Procedure Laterality Date  . ANKLE SURGERY Left   . COLONOSCOPY  02/2018   Dr. Gabriel Cirri: adenomas  . HIP SURGERY Right   . INTRAMEDULLARY (IM) NAIL INTERTROCHANTERIC Left 05/23/2017   Procedure: Percutaneous Pinning of the left hip;  Surgeon: Yolonda Kida, MD;  Location: [facility];  Service: Orthopedics;  Laterality: Left;   . PARTIAL HYSTERECTOMY     fibroid tumors   . RECTOCELE REPAIR    . REPLACEMENT TOTAL KNEE Right    X 3     Current Meds  Medication Sig  . CALCIUM-VITAMIN D PO Take 1 tablet by mouth daily.   . flecainide (TAMBOCOR) 50 MG tablet TAKE 1 TABLET BY MOUTH TWICE DAILY  . furosemide (LASIX) 40 MG tablet Take 40 mg by mouth daily.  Marland Kitchen HYDROcodone-acetaminophen (NORCO) 10-325 MG tablet Take 1 tablet by mouth as needed.  . metoprolol succinate (TOPROL-XL) 25 MG 24 hr tablet TAKE 1 TABLET BY MOUTH DAILY  . metroNIDAZOLE (METROCREAM) 0.75 % cream Apply 1 application topically daily.  . Multiple Vitamin (MULTIVITAMIN) tablet Take 1 tablet by mouth daily.    . naproxen sodium (ALEVE) 220 MG tablet Take 220 mg by mouth as needed.  . polyethylene glycol (MIRALAX / GLYCOLAX) packet Take 17 g by mouth daily.  . potassium chloride SA (K-DUR,KLOR-CON) 20 MEQ tablet Take 20 mEq by mouth daily.     Family History  Problem Relation Age of Onset  . Heart attack Mother   . Other Father        blood poisioning  . Heart attack Maternal Grandmother   . Colon cancer Neg Hx   . Colon polyps Neg Hx     Social History   Socioeconomic History  . Marital status: Widowed    Spouse name: Not on file  . Number of children: Not on file  . Years  of education: Not on file  . Highest education level: Not on file  Occupational History  . Not on file  Social Needs  . Financial resource strain: Not on file  . Food insecurity:    Worry: Not on file    Inability: Not on file  . Transportation needs:    Medical: Not on file    Non-medical: Not on file  Tobacco Use  . Smoking status: Never Smoker  . Smokeless tobacco: Never Used  . Tobacco comment: does not smoke.   Substance and Sexual Activity  . Alcohol use: No  . Drug use: No  . Sexual activity: Not Currently  Lifestyle  . Physical activity:    Days per week: Not on file    Minutes per session: Not on file  . Stress: Not on file  Relationships   . Social connections:    Talks on phone: Not on file    Gets together: Not on file    Attends religious service: Not on file    Active member of club or organization: Not on file    Attends meetings of clubs or organizations: Not on file    Relationship status: Not on file  Other Topics Concern  . Not on file  Social History Narrative   Widowed.    (medical history: coumadin therapy ; follow-up by Dr. Sherril CroonVyas )       Review of Systems: Gen: Denies fever, chills, anorexia. Denies fatigue, weakness, weight loss.  CV: Denies chest pain, palpitations, syncope, peripheral edema, and claudication. Resp: Denies dyspnea at rest, cough, wheezing, coughing up blood, and pleurisy. GI: see HPI Derm: Denies rash, itching, dry skin Psych: Denies depression, anxiety, memory loss, confusion. No homicidal or suicidal ideation.  Heme: Denies bruising, bleeding, and enlarged lymph nodes.  Observations/Objective: No distress. Unable to perform physical exam due to telephone encounter. No video available.   Assessment and Plan: Chronic constipation not ideally managed. Patient would like to stay with OTC options. We will have her  continue Benefiber in the evening, Miralax each morning, and resume senokot as this has worked best for her in the past. Provided handout on generic senokot that will be more cost-effective. Return in 6 months or sooner if needed.    Follow Up Instructions:    I discussed the assessment and treatment plan with the patient. The patient was provided an opportunity to ask questions and all were answered. The patient agreed with the plan and demonstrated an understanding of the instructions.   The patient was advised to call back or seek an in-person evaluation if the symptoms worsen or if the condition fails to improve as anticipated.  I provided 15 minutes of non-face-to-face time during this encounter.  Gelene MinkAnna W. Sadae Arrazola, PhD, ANP-BC [facility]

## 2018-10-14 ENCOUNTER — Encounter: Payer: Self-pay | Admitting: Gastroenterology

## 2018-10-15 NOTE — Progress Notes (Signed)
CC'ED TO PCP 

## 2018-11-19 ENCOUNTER — Telehealth: Payer: Self-pay | Admitting: *Deleted

## 2018-11-19 ENCOUNTER — Encounter: Payer: Self-pay | Admitting: *Deleted

## 2018-11-19 NOTE — Telephone Encounter (Signed)
Patient calling with c/o edema in legs.  Been bothering at least a month.  No weight gain.  States she has lost 20 pounds over the last year.  No dizziness or chest pain.  SOB x several months.  States she has fallen over the last several months, just goes down.  Walks with a cain, does not pass out.  Stated that her pmd did Echo and checked for blood clots in her legs, test done at [facility].  States that Dr. Pleas Koch didn't know what else to do to help her.  Offered appointment in [place] office, but stated she could not manage that location.  Family all live out of town.  Notes from [place] requested now.

## 2018-11-20 NOTE — Telephone Encounter (Signed)
    Reviewed lab results which showed stable renal function and electrolytes along with normal hemoglobin. Echocardiogram reviewed which showed a preserved EF with only mild leakage along the aortic valve. Would continue to follow daily weights and she can take an extra Lasix tablet if needed for weight gain greater than 2-3 lbs overnight or 5 lbs in one week.   Given her multiple issues, I agree this would best be addressed during an office visit.Can her visit with Dr. Domenic Polite in [place] be moved up if unable to come to [place]? Could also offer telehealth visit if patient in agreement as this could be scheduled at either [place] or [place].   Signed, Erma Heritage, PA-C 11/20/2018, 10:59 AM Pager: [phone]

## 2018-11-20 NOTE — Telephone Encounter (Signed)
Left message to return call 

## 2018-11-20 NOTE — Telephone Encounter (Signed)
Labs scanned yesterday done on 11/05/2018.  No other notes received yet, but can see Echo in "Care Everywhere".

## 2018-11-20 NOTE — Telephone Encounter (Addendum)
Patient notified.  Offered earlier OV in [place] office, but she declined.  Transportation is also an issue for her.  Stated that she prefers to wait on Dr. Domenic Polite & wants to see him in office.  Did not want to do VV either.  She will call office back if she changes her mind - she will keep the 12/19/2018 OV as scheduled.

## 2018-12-17 ENCOUNTER — Other Ambulatory Visit: Payer: Self-pay | Admitting: Cardiology

## 2018-12-19 ENCOUNTER — Other Ambulatory Visit: Payer: Self-pay

## 2018-12-19 ENCOUNTER — Encounter: Payer: Self-pay | Admitting: Cardiology

## 2018-12-19 ENCOUNTER — Ambulatory Visit (INDEPENDENT_AMBULATORY_CARE_PROVIDER_SITE_OTHER): Payer: Medicare Other | Admitting: Cardiology

## 2018-12-19 VITALS — BP 144/70 | HR 63 | Ht 60.0 in | Wt 123.8 lb

## 2018-12-19 DIAGNOSIS — I480 Paroxysmal atrial fibrillation: Secondary | ICD-10-CM

## 2018-12-19 DIAGNOSIS — I890 Lymphedema, not elsewhere classified: Secondary | ICD-10-CM | POA: Diagnosis not present

## 2018-12-19 DIAGNOSIS — R600 Localized edema: Secondary | ICD-10-CM

## 2018-12-19 MED ORDER — TORSEMIDE 20 MG PO TABS: 40.0000 mg | ORAL_TABLET | Freq: Every day | ORAL | 3 refills | Status: DC

## 2018-12-19 NOTE — Progress Notes (Signed)
Cardiology Office Note  Date: 12/19/2018   ID: [name] FlowBetty F Minerva, DOB [birth year], MRN [id number]  PCP:  Juliette AlcideBurdine, Steven E, MD  Cardiologist:  Nona DellSamuel Lilyonna Steidle, MD Electrophysiologist:  None   Chief Complaint  Patient presents with  . Cardiac follow-up    History of Present Illness: [name] [name] is an 85 y.o. female last seen in January.  She presents for a follow-up visit.  She states that she has had progressive trouble with leg swelling, predominantly below the knees bilaterally and also involving the tops of her feet.  She has been evaluated by Dr. Leandrew KoyanagiBurdine, had lower extremity venous studies that were negative for DVT, lab work is reviewed below.  This has not improved much despite doubling her Lasix for about a week.  She had a recent echocardiogram done per Dr. Leandrew KoyanagiBurdine demonstrating preserved LVEF with mild aortic regurgitation.  Today we went over her medications.  We discussed switching from Lasix to Demadex at 40 mg daily, continuing potassium supplements.  She will need a follow-up BMET.  We will also make a referral to the wound clinic.  I personally reviewed her ECG today which shows sinus rhythm with poor R wave progression, QRS duration 118 ms.  Past Medical History:  Diagnosis Date  . Chronic edema   . History of cardiovascular stress test    Negative dobutamine echo 6/13  . Osteoarthritis   . Paroxysmal atrial fibrillation (HCC)   . PFO (patent foramen ovale)   . Rosacea     Past Surgical History:  Procedure Laterality Date  . ANKLE SURGERY Left   . COLONOSCOPY  02/2018   Dr. Gabriel CirrieMason: adenomas  . HIP SURGERY Right   . INTRAMEDULLARY (IM) NAIL INTERTROCHANTERIC Left 05/23/2017   Procedure: Percutaneous Pinning of the left hip;  Surgeon: Yolonda Kidaogers, Jason Patrick, MD;  Location: [facility];  Service: Orthopedics;  Laterality: Left;  . PARTIAL HYSTERECTOMY     fibroid tumors   . RECTOCELE REPAIR    . REPLACEMENT TOTAL KNEE Right    X 3    Current Outpatient  Medications  Medication Sig Dispense Refill  . CALCIUM-VITAMIN D PO Take 1 tablet by mouth daily.     . flecainide (TAMBOCOR) 50 MG tablet TAKE 1 TABLET BY MOUTH TWICE DAILY 180 tablet 3  . HYDROcodone-acetaminophen (NORCO) 10-325 MG tablet Take 1 tablet by mouth as needed.    . metoprolol succinate (TOPROL-XL) 25 MG 24 hr tablet TAKE 1 TABLET BY MOUTH EVERY DAY 90 tablet 0  . metroNIDAZOLE (METROCREAM) 0.75 % cream Apply 1 application topically daily.    . Multiple Vitamin (MULTIVITAMIN) tablet Take 1 tablet by mouth daily.      . naproxen sodium (ALEVE) 220 MG tablet Take 220 mg by mouth as needed.    . polyethylene glycol (MIRALAX / GLYCOLAX) packet Take 17 g by mouth daily.    . potassium chloride SA (K-DUR,KLOR-CON) 20 MEQ tablet Take 20 mEq by mouth daily.    Marland Kitchen. torsemide (DEMADEX) 20 MG tablet Take 2 tablets (40 mg total) by mouth daily. 180 tablet 3   No current facility-administered medications for this visit.    Allergies:  Penicillins, Cephalexin, Penicillin g, Digoxin, and Latex   Social History: The patient  reports that she has never smoked. She has never used smokeless tobacco. She reports that she does not drink alcohol or use drugs.   ROS:  Please see the history of present illness. Otherwise, complete review of systems is positive for  ambulation with a walker, no falls.  All other systems are reviewed and negative.   Physical Exam: VS:  BP (!) 144/70   Pulse 63   Ht 5' (1.524 m)   Wt 123 lb 12.8 oz (56.2 kg)   SpO2 97%   BMI 24.18 kg/m , BMI Body mass index is 24.18 kg/m.  Wt Readings from Last 3 Encounters:  12/19/18 123 lb 12.8 oz (56.2 kg)  07/26/18 125 lb 9.6 oz (57 kg)  06/04/18 126 lb (57.2 kg)    General: Elderly woman, appears comfortable at rest. HEENT: Conjunctiva and lids normal, oropharynx clear. Neck: Supple, no elevated JVP or carotid bruits, no thyromegaly. Lungs: Clear to auscultation, nonlabored breathing at rest. Cardiac: Regular rate and  rhythm, no S3, soft systolic murmur, no pericardial rub. Abdomen: Soft, nontender, bowel sounds present. Extremities: 3+ lower leg edema with lymphedema, venous stasis with associated skin discoloration, distal pulses 1+. Skin: Warm and dry. Musculoskeletal: No kyphosis. Neuropsychiatric: Alert and oriented x3, affect grossly appropriate.  ECG:  An ECG dated 11/19/2017 was personally reviewed today and demonstrated:  Sinus bradycardia with left anterior fascicular block, QRS 92 ms, nonspecific T wave changes.  Recent Labwork:  June 2020: BUN 20, creatinine 0.85, potassium 3.8, AST 24, ALT 16, hemoglobin 13.6, platelets 198  Other Studies Reviewed Today:  Lexiscan Myoview 03/16/2016 [facility]): No myocardial perfusion defects to indicate scar or ischemia, LVEF 69%.  Assessment and Plan:  1.  Lower extremity edema/lymphedema.  Lasix will be changed to Demadex at 40 mg daily, continue potassium supplements and check BMET in 1 to 2 weeks.  Referral being placed to the wound center for mechanical compression.  2.  Paroxysmal atrial fibrillation.  She continues to do well on Toprol-XL and flecainide, no breakthrough palpitations.  As noted previously, she declines long-term anticoagulation.  ECG reviewed.  Medication Adjustments/Labs and Tests Ordered: Current medicines are reviewed at length with the patient today.  Concerns regarding medicines are outlined above.   Tests Ordered: Orders Placed This Encounter  Procedures  . Basic metabolic panel  . Ambulatory referral to Occupational Therapy  . EKG 12-Lead    Medication Changes: Meds ordered this encounter  Medications  . torsemide (DEMADEX) 20 MG tablet    Sig: Take 2 tablets (40 mg total) by mouth daily.    Dispense:  180 tablet    Refill:  3    12/19/2018 NEW--stop furosemide    Disposition:  Follow up 3 months in the [place] office.  Signed, Satira Sark, MD, [facility] 12/19/2018 1:44 PM    [facility] [place], [place], [place] [address] Phone: [phone]; Fax: [phone]

## 2018-12-19 NOTE — Patient Instructions (Signed)
Medication Instructions:   Your physician has recommended you make the following change in your medication:   Stop furosemide  Start torsemide 40 mg by mouth daily  Continue all other medications the same  Labwork:  Your physician recommends that you return for lab work in: 2 weeks to check your BMET. You may have this done at [facility]. Your lab order has been given to you today.  Testing/Procedures:  NONE  Follow-Up:  Your physician recommends that you schedule a follow-up appointment in: 3 months.  Any Other Special Instructions Will Be Listed Below (If Applicable).  You have been referred to the Lymphedema Clinic in [place].  If you need a refill on your cardiac medications before your next appointment, please call your pharmacy.

## 2019-01-02 ENCOUNTER — Encounter: Payer: Self-pay | Admitting: Cardiology

## 2019-01-06 ENCOUNTER — Telehealth: Payer: Self-pay | Admitting: *Deleted

## 2019-01-06 NOTE — Telephone Encounter (Signed)
-----   Message from Erma Heritage, [place] sent at 01/06/2019 11:41 AM EDT ----- Covering for Dr. Domenic Polite. Please let the patient know her electrolytes and renal function remain stable. Continue current medication regimen. Please forward a copy of results to [place], Virgina Evener, MD.

## 2019-01-06 NOTE — Telephone Encounter (Signed)
Patient informed. Copy sent to PCP °

## 2019-03-16 ENCOUNTER — Other Ambulatory Visit: Payer: Self-pay | Admitting: Cardiology

## 2019-03-27 ENCOUNTER — Encounter: Payer: Self-pay | Admitting: *Deleted

## 2019-03-27 ENCOUNTER — Other Ambulatory Visit: Payer: Self-pay

## 2019-03-27 ENCOUNTER — Encounter: Payer: Self-pay | Admitting: Cardiology

## 2019-03-27 ENCOUNTER — Ambulatory Visit (INDEPENDENT_AMBULATORY_CARE_PROVIDER_SITE_OTHER): Payer: Medicare Other | Admitting: Cardiology

## 2019-03-27 VITALS — BP 145/68 | HR 58 | Ht 60.0 in | Wt 123.8 lb

## 2019-03-27 DIAGNOSIS — R600 Localized edema: Secondary | ICD-10-CM | POA: Diagnosis not present

## 2019-03-27 DIAGNOSIS — I480 Paroxysmal atrial fibrillation: Secondary | ICD-10-CM

## 2019-03-27 DIAGNOSIS — I890 Lymphedema, not elsewhere classified: Secondary | ICD-10-CM

## 2019-03-27 NOTE — Progress Notes (Signed)
Cardiology Office Note  Date: 03/27/2019   ID: [name] [name], DOB [birth year], MRN [id number]  PCP:  Curlene Labrum, MD  Cardiologist:  Rozann Lesches, MD Electrophysiologist:  None   Chief Complaint  Patient presents with  . Cardiac follow-up    History of Present Illness: [name] [name] is an 85 y.o. female last seen in July.  She presents for a follow-up visit.  She tells me that her leg swelling/lymphedema has been generally better.  She did have PT and is now doing exercises/compression at home.  She has been tolerating Demadex which we continued at 40 mg daily.  Her creatinine was 0.98 with potassium 3.5 in August.  I am requesting more recent lab work from Dr. Pleas Koch.   Otherwise, she has had no palpitations or chest pain.  We went over her medications and she continues to tolerate flecainide and Toprol-XL.  Past Medical History:  Diagnosis Date  . Chronic edema   . History of cardiovascular stress test    Negative dobutamine echo 6/13  . Osteoarthritis   . Paroxysmal atrial fibrillation (HCC)   . PFO (patent foramen ovale)   . Rosacea     Past Surgical History:  Procedure Laterality Date  . ANKLE SURGERY Left   . COLONOSCOPY  02/2018   Dr. Anthony Sar: adenomas  . HIP SURGERY Right   . INTRAMEDULLARY (IM) NAIL INTERTROCHANTERIC Left 05/23/2017   Procedure: Percutaneous Pinning of the left hip;  Surgeon: Nicholes Stairs, MD;  Location: [facility];  Service: Orthopedics;  Laterality: Left;  . PARTIAL HYSTERECTOMY     fibroid tumors   . RECTOCELE REPAIR    . REPLACEMENT TOTAL KNEE Right    X 3    Current Outpatient Medications  Medication Sig Dispense Refill  . CALCIUM-VITAMIN D PO Take 1 tablet by mouth daily.     . flecainide (TAMBOCOR) 50 MG tablet TAKE 1 TABLET BY MOUTH TWICE DAILY 180 tablet 3  . HYDROcodone-acetaminophen (NORCO) 10-325 MG tablet Take 1 tablet by mouth as needed.    . metoprolol succinate (TOPROL-XL) 25 MG 24 hr tablet TAKE 1  TABLET BY MOUTH EVERY DAY 90 tablet 0  . metroNIDAZOLE (METROCREAM) 0.75 % cream Apply 1 application topically daily.    . Multiple Vitamin (MULTIVITAMIN) tablet Take 1 tablet by mouth daily.      . naproxen sodium (ALEVE) 220 MG tablet Take 220 mg by mouth as needed.    . polyethylene glycol (MIRALAX / GLYCOLAX) packet Take 17 g by mouth daily.    . potassium chloride SA (K-DUR,KLOR-CON) 20 MEQ tablet Take 20 mEq by mouth daily.    Marland Kitchen torsemide (DEMADEX) 20 MG tablet Take 2 tablets (40 mg total) by mouth daily. 180 tablet 3   No current facility-administered medications for this visit.    Allergies:  Penicillins, Cephalexin, Penicillin g, Digoxin, and Latex   Social History: The patient  reports that she has never smoked. She has never used smokeless tobacco. She reports that she does not drink alcohol or use drugs.   ROS:  Please see the history of present illness. Otherwise, complete review of systems is positive for chronic back pain.  All other systems are reviewed and negative.   Physical Exam: VS:  BP (!) 145/68   Pulse (!) 58   Ht 5' (1.524 m)   Wt 123 lb 12.8 oz (56.2 kg)   SpO2 99%   BMI 24.18 kg/m , BMI Body mass index is 24.18  kg/m.  Wt Readings from Last 3 Encounters:  03/27/19 123 lb 12.8 oz (56.2 kg)  12/19/18 123 lb 12.8 oz (56.2 kg)  07/26/18 125 lb 9.6 oz (57 kg)    General: Elderly woman, appears comfortable at rest.  Using a cane. HEENT: Conjunctiva and lids normal, wearing a mask. Neck: Supple, no elevated JVP or carotid bruits, no thyromegaly. Lungs: Clear to auscultation, nonlabored breathing at rest. Cardiac: Regular rate and rhythm, no S3, soft significant systolic murmur. Abdomen: Soft, nontender, bowel sounds present. Extremities: Lymphedema, right greater than left, distal pulses 1+. Skin: Warm and dry. Musculoskeletal: No kyphosis. Neuropsychiatric: Alert and oriented x3, affect grossly appropriate.  ECG:  An ECG dated 12/19/2018 was personally  reviewed today and demonstrated:  Sinus rhythm with decreased R wave progression and left anterior fascicular block.  Recent Labwork:  June 2020: BUN 20, creatinine 0.85, potassium 3.8, AST 24, ALT 16, hemoglobin 13.6, platelets 198 August 2020: BUN 26, creatinine 0.98, potassium 3.5  Other Studies Reviewed Today:  Lexiscan Myoview 03/16/2016 [facility]): No myocardial perfusion defects to indicate scar or ischemia, LVEF 69%.  Assessment and Plan:  1.  Lower extremity edema/lymphedema.  Continue Demadex although requesting recent lab work from Dr. Leandrew Koyanagi.  If there has been progressive renal insufficiency we may cut back the dose somewhat.  She is using compression stockings and other mechanical compression maneuvers at home after PT as an outpatient.  2.  Paroxysmal atrial fibrillation.  She declines long-term anticoagulation.  We will continue flecainide and Toprol-XL.  Medication Adjustments/Labs and Tests Ordered: Current medicines are reviewed at length with the patient today.  Concerns regarding medicines are outlined above.   Tests Ordered: No orders of the defined types were placed in this encounter.   Medication Changes: No orders of the defined types were placed in this encounter.   Disposition:  Follow up 6 months in the [place] office.  Signed, Jonelle Sidle, MD, [facility] 03/27/2019 2:32 PM    [facility] at [facility] [address] [place], [place], [place] [address] Phone: [phone]; Fax: [phone]

## 2019-03-27 NOTE — Patient Instructions (Addendum)
Medication Instructions:   Your physician recommends that you continue on your current medications as directed. Please refer to the Current Medication list given to you today.  Labwork:  NONE  Testing/Procedures:  NONE  Follow-Up:  Your physician recommends that you schedule a follow-up appointment in: 3 months.  Any Other Special Instructions Will Be Listed Below (If Applicable).  If you need a refill on your cardiac medications before your next appointment, please call your pharmacy. 

## 2019-03-31 ENCOUNTER — Telehealth: Payer: Self-pay | Admitting: *Deleted

## 2019-03-31 NOTE — Telephone Encounter (Signed)
-----   Message from Satira Sark, MD sent at 03/30/2019  2:35 PM EST ----- Results reviewed.  Creatinine 1.04, slightly increased from prior check, potassium normal.  Would continue same dose of Demadex for now.

## 2019-03-31 NOTE — Telephone Encounter (Signed)
Patient informed. 

## 2019-04-16 ENCOUNTER — Ambulatory Visit (INDEPENDENT_AMBULATORY_CARE_PROVIDER_SITE_OTHER): Payer: Medicare Other | Admitting: Gastroenterology

## 2019-04-16 ENCOUNTER — Other Ambulatory Visit: Payer: Self-pay

## 2019-04-16 ENCOUNTER — Encounter: Payer: Self-pay | Admitting: Gastroenterology

## 2019-04-16 VITALS — BP 148/69 | HR 56 | Temp 96.6°F | Ht 60.0 in | Wt 124.4 lb

## 2019-04-16 DIAGNOSIS — K5900 Constipation, unspecified: Secondary | ICD-10-CM

## 2019-04-16 NOTE — Progress Notes (Signed)
Referring Provider: Juliette Alcide, MD Primary Care Physician:  Juliette Alcide, MD Primary GI: Dr. Darrick Penna   Chief Complaint  Patient presents with  . Constipation    doing ok    HPI:   [name] [name] is a 85 y.o. female presenting today with a history of chroni constipation, desiring to stay with OTC agents historically. Colonoscopy on file recently.   At last visit, recommended continuing Benefiber in evenings, Miralax each morning, and resume Senokot.   Currently taking Senokot, fiber, Miralax. Feels like it doesn't want to come out of the rectum. When takes benefiber in the evenings, not hard to go. Overall improvement with BMs with regimen noted. Notes easier passage and less straining after taking Benefiber.,   Oct 2019 CT: IMPRESSION: 1. No acute abnormalities of the chest. 2. New severe compression fracture of L1 with progression of compression fracture of L2 with protrusion of bone into the spinal canal at both levels which could create neural impingement. 3. Marked decrease in the chronic anterior pelvic hematoma. 4. Prominent soft tissue at the anus, probably representing rectal prolapse. However, I cannot exclude a mass. 5. Aortic Atherosclerosis (ICD10-I70.0).  Colonoscopy completed thereafter.   Past Medical History:  Diagnosis Date  . Chronic edema   . History of cardiovascular stress test    Negative dobutamine echo 6/13  . Osteoarthritis   . Paroxysmal atrial fibrillation (HCC)   . PFO (patent foramen ovale)   . Rosacea     Past Surgical History:  Procedure Laterality Date  . ANKLE SURGERY Left   . COLONOSCOPY  02/2018   Dr. Gabriel Cirri: adenomas  . HIP SURGERY Right   . INTRAMEDULLARY (IM) NAIL INTERTROCHANTERIC Left 05/23/2017   Procedure: Percutaneous Pinning of the left hip;  Surgeon: Yolonda Kida, MD;  Location: [facility];  Service: Orthopedics;  Laterality: Left;  . PARTIAL HYSTERECTOMY     fibroid tumors   . RECTOCELE REPAIR     . REPLACEMENT TOTAL KNEE Right    X 3    Current Outpatient Medications  Medication Sig Dispense Refill  . CALCIUM-VITAMIN D PO Take 1 tablet by mouth daily.     . flecainide (TAMBOCOR) 50 MG tablet TAKE 1 TABLET BY MOUTH TWICE DAILY 180 tablet 3  . HYDROcodone-acetaminophen (NORCO) 10-325 MG tablet Take 1 tablet by mouth as needed.    . metoprolol succinate (TOPROL-XL) 25 MG 24 hr tablet TAKE 1 TABLET BY MOUTH EVERY DAY 90 tablet 0  . metroNIDAZOLE (METROCREAM) 0.75 % cream Apply 1 application topically daily.    . Multiple Vitamin (MULTIVITAMIN) tablet Take 1 tablet by mouth daily.      . naproxen sodium (ALEVE) 220 MG tablet Take 220 mg by mouth as needed.    . polyethylene glycol (MIRALAX / GLYCOLAX) packet Take 17 g by mouth daily.    . potassium chloride SA (K-DUR,KLOR-CON) 20 MEQ tablet Take 20 mEq by mouth daily.    Marland Kitchen torsemide (DEMADEX) 20 MG tablet Take 2 tablets (40 mg total) by mouth daily. 180 tablet 3   No current facility-administered medications for this visit.     Allergies as of 04/16/2019 - Review Complete 04/16/2019  Allergen Reaction Noted  . Penicillins Hives 02/14/2013  . Cephalexin Nausea And Vomiting 04/28/2008  . Penicillin g Dermatitis and Rash 06/05/2017  . Digoxin  07/14/2009  . Latex  05/23/2017    Family History  Problem Relation Age of Onset  . Heart attack Mother   .  Other Father        blood poisioning  . Heart attack Maternal Grandmother   . Colon cancer Neg Hx   . Colon polyps Neg Hx     Social History   Socioeconomic History  . Marital status: Widowed    Spouse name: Not on file  . Number of children: Not on file  . Years of education: Not on file  . Highest education level: Not on file  Occupational History  . Not on file  Social Needs  . Financial resource strain: Not on file  . Food insecurity    Worry: Not on file    Inability: Not on file  . Transportation needs    Medical: Not on file    Non-medical: Not on file   Tobacco Use  . Smoking status: Never Smoker  . Smokeless tobacco: Never Used  . Tobacco comment: does not smoke.   Substance and Sexual Activity  . Alcohol use: No  . Drug use: No  . Sexual activity: Not Currently  Lifestyle  . Physical activity    Days per week: Not on file    Minutes per session: Not on file  . Stress: Not on file  Relationships  . Social Herbalist on phone: Not on file    Gets together: Not on file    Attends religious service: Not on file    Active member of club or organization: Not on file    Attends meetings of clubs or organizations: Not on file    Relationship status: Not on file  Other Topics Concern  . Not on file  Social History Narrative   Widowed.    (medical history: coumadin therapy ; follow-up by Dr. Woody Seller )    Review of Systems: Gen: Denies fever, chills, anorexia. Denies fatigue, weakness, weight loss.  CV: Denies chest pain, palpitations, syncope, peripheral edema, and claudication. Resp: Denies dyspnea at rest, cough, wheezing, coughing up blood, and pleurisy. GI: see HPI Derm: Denies rash, itching, dry skin Psych: Denies depression, anxiety, memory loss, confusion. No homicidal or suicidal ideation.  Heme: Denies bruising, bleeding, and enlarged lymph nodes.  Physical Exam: BP (!) 148/69   Pulse (!) 56   Temp (!) 96.6 F (35.9 C) (Temporal)   Ht 5' (1.524 m)   Wt 124 lb 6.4 oz (56.4 kg)   BMI 24.30 kg/m  General:   Alert and oriented. No distress noted. Pleasant and cooperative.  Head:  Normocephalic and atraumatic. Abdomen:  +BS, soft, non-tender and non-distended. No rebound or guarding. No HSM or masses noted. Rectal: external exam unrevealing. DRE without mass. Poor sphincter tone Msk:  Severe kyphosis. Gait unsteady, requiring cane.  Extremities:  Without edema. Neurologic:  Alert and  oriented x4 Psych:  Alert and cooperative. Normal mood and affect.  ASSESSMENT: [name] [name] is an 85 y.o. female  presenting today with history of chronic constipation, likely element of pelvic floor dysfunction, with some improvement adding Benefiber, continuing Senokot and Miralax regimen. She does not want to take prescriptive agents. Notably, she is able to pass stool easiest after taking Benefiber in the afternoon. Will increase this dosage for now. Colonoscopy recently on file.    PLAN:  Increase Benefiber to 3 teaspoons as tolerated, take in morning Continue Miralax in morning Senokot in afternoon Call with update 4-6 month return   Annitta Needs, PhD, ANP-BC [facility]astro[facility]

## 2019-04-16 NOTE — Patient Instructions (Signed)
Let's increase Benefiber to 3 teaspoons in the morning. Continue with Senokot and Miralax as you are doing.   Please call us to update!  We will see you in 4-6 months!  I enjoyed seeing you again today! As you know, I value our relationship and want to provide genuine, compassionate, and quality care. I welcome your feedback. If you receive a survey regarding your visit,  I greatly appreciate you taking time to fill this out. See you next time!  Annitta Needs, PhD, ANP-BC [facility] Gastroenterology

## 2019-04-18 ENCOUNTER — Other Ambulatory Visit: Payer: Self-pay

## 2019-06-15 ENCOUNTER — Other Ambulatory Visit: Payer: Self-pay | Admitting: Cardiology

## 2019-07-03 ENCOUNTER — Encounter: Payer: Self-pay | Admitting: *Deleted

## 2019-07-03 ENCOUNTER — Other Ambulatory Visit: Payer: Self-pay

## 2019-07-03 ENCOUNTER — Ambulatory Visit (INDEPENDENT_AMBULATORY_CARE_PROVIDER_SITE_OTHER): Payer: Medicare Other | Admitting: Cardiology

## 2019-07-03 ENCOUNTER — Encounter: Payer: Self-pay | Admitting: Cardiology

## 2019-07-03 VITALS — BP 155/72 | HR 59 | Ht 60.0 in | Wt 122.0 lb

## 2019-07-03 DIAGNOSIS — I890 Lymphedema, not elsewhere classified: Secondary | ICD-10-CM | POA: Diagnosis not present

## 2019-07-03 DIAGNOSIS — I480 Paroxysmal atrial fibrillation: Secondary | ICD-10-CM | POA: Diagnosis not present

## 2019-07-03 NOTE — Patient Instructions (Addendum)

## 2019-07-03 NOTE — Progress Notes (Signed)
Cardiology Office Note  Date: 07/03/2019   ID: RASHAWN [name], DOB 1927-07-12, MRN [id number]  PCP:  Juliette Alcide, MD  Cardiologist:  Nona Dell, MD Electrophysiologist:  None   Chief Complaint  Patient presents with  . Cardiac follow-up    History of Present Illness: [name] [name] is an 86 y.o. female last seen in October 2020.  She presents for a routine visit.  She does not report any palpitations on current medical therapy.  She remains very limited due to chronic back pain, uses a cane, still getting intermittent pain control injections.  I reviewed her lab work from October 2020 as outlined below.  She continues to follow with Dr. Leandrew Koyanagi.  I reviewed her medications which are stable from a cardiac perspective and include Toprol-XL and flecainide.  She remains on Demadex with potassium supplements for treatment of lymphedema.  She did not like using the home compression device, but does use stockings and maneuvers that she learned with PT.  Past Medical History:  Diagnosis Date  . Chronic edema   . History of cardiovascular stress test    Negative dobutamine echo 6/13  . Osteoarthritis   . Paroxysmal atrial fibrillation (HCC)   . PFO (patent foramen ovale)   . Rosacea     Past Surgical History:  Procedure Laterality Date  . ANKLE SURGERY Left   . COLONOSCOPY  02/2018   Dr. Gabriel Cirri: adenomas  . HIP SURGERY Right   . INTRAMEDULLARY (IM) NAIL INTERTROCHANTERIC Left 05/23/2017   Procedure: Percutaneous Pinning of the left hip;  Surgeon: Yolonda Kida, MD;  Location: [facility];  Service: Orthopedics;  Laterality: Left;  . PARTIAL HYSTERECTOMY     fibroid tumors   . RECTOCELE REPAIR    . REPLACEMENT TOTAL KNEE Right    X 3    Current Outpatient Medications  Medication Sig Dispense Refill  . CALCIUM-VITAMIN D PO Take 1 tablet by mouth daily.     . flecainide (TAMBOCOR) 50 MG tablet TAKE 1 TABLET BY MOUTH TWICE DAILY 180 tablet 3  .  HYDROcodone-acetaminophen (NORCO) 10-325 MG tablet Take 1 tablet by mouth as needed.    . metoprolol succinate (TOPROL-XL) 25 MG 24 hr tablet TAKE 1 TABLET BY MOUTH EVERY DAY 90 tablet 3  . Multiple Vitamin (MULTIVITAMIN) tablet Take 1 tablet by mouth daily.      . naproxen sodium (ALEVE) 220 MG tablet Take 220 mg by mouth as needed.    . polyethylene glycol (MIRALAX / GLYCOLAX) packet Take 17 g by mouth daily.    . potassium chloride SA (K-DUR,KLOR-CON) 20 MEQ tablet Take 20 mEq by mouth daily.    Marland Kitchen torsemide (DEMADEX) 20 MG tablet Take 2 tablets (40 mg total) by mouth daily. 180 tablet 3   No current facility-administered medications for this visit.   Allergies:  Penicillins, Cephalexin, Penicillin g, Digoxin, and Latex   Social History: The patient  reports that she has never smoked. She has never used smokeless tobacco. She reports that she does not drink alcohol or use drugs.   ROS:  Please see the history of present illness. Otherwise, complete review of systems is positive for chronic back pain.  All other systems are reviewed and negative.   Physical Exam: VS:  BP (!) 155/72   Pulse (!) 59   Ht 5' (1.524 m)   Wt 122 lb (55.3 kg)   SpO2 99%   BMI 23.83 kg/m , BMI Body mass index is  23.83 kg/m.  Wt Readings from Last 3 Encounters:  07/03/19 122 lb (55.3 kg)  04/16/19 124 lb 6.4 oz (56.4 kg)  03/27/19 123 lb 12.8 oz (56.2 kg)    General: Elderly woman, patient appears comfortable at rest.  Using a cane. HEENT: Conjunctiva and lids normal, wearing a mask. Neck: Supple, no elevated JVP or carotid bruits, no thyromegaly. Lungs: Clear to auscultation, nonlabored breathing at rest. Cardiac: Regular rate and rhythm, no S3, soft systolic murmur. Abdomen: Soft, nontender, bowel sounds present. Extremities: Lymphedema with venous stasis, right greater than left,, distal pulses 1-2+. Skin: Warm and dry. Musculoskeletal: Significant kyphosis. Neuropsychiatric: Alert and oriented x3,  affect grossly appropriate.  ECG:  An ECG dated 12/19/2018 was personally reviewed today and demonstrated:  Sinus rhythm with decreased R wave progression and left anterior fascicular block.  Recent Labwork:  October 2020: BUN 28, creatinine 1.04, potassium 3.9, AST 18, ALT 10, hemoglobin 13.8, platelets 188, cholesterol 242, triglycerides 174, HDL 62, LDL 149, TSH 4.7, hemoglobin A1c 5.8%  Other Studies Reviewed Today:  Lexiscan Myoview 03/16/2016 [facility]): No myocardial perfusion defects to indicate scar or ischemia, LVEF 69%.  Assessment and Plan:  1.  Chronic lymphedema.  She continues on Demadex with potassium supplements, also using compression stockings and maneuvers she learned with PT.  Recent lab work reviewed.  Plan to continue present dosing.  2.  Paroxysmal atrial fibrillation.  She continues on flecainide and Toprol-XL with good control of symptoms.  CHA2DS2-VASc score is 3.  She declines anticoagulation.  Medication Adjustments/Labs and Tests Ordered: Current medicines are reviewed at length with the patient today.  Concerns regarding medicines are outlined above.   Tests Ordered: No orders of the defined types were placed in this encounter.   Medication Changes: No orders of the defined types were placed in this encounter.   Disposition:  Follow up 6 months in the [place] office.  Signed, Satira Sark, MD, [facility] 07/03/2019 2:10 PM    [place] at [place], [place], [place] [address] Phone: [phone]; Fax: [phone]

## 2019-07-15 ENCOUNTER — Other Ambulatory Visit: Payer: Self-pay | Admitting: Cardiology

## 2019-08-20 ENCOUNTER — Other Ambulatory Visit: Payer: Self-pay

## 2019-08-20 ENCOUNTER — Encounter: Payer: Self-pay | Admitting: Gastroenterology

## 2019-08-20 ENCOUNTER — Ambulatory Visit (INDEPENDENT_AMBULATORY_CARE_PROVIDER_SITE_OTHER): Payer: Medicare Other | Admitting: Gastroenterology

## 2019-08-20 VITALS — BP 135/65 | HR 60 | Temp 97.3°F | Ht 60.0 in | Wt 125.2 lb

## 2019-08-20 DIAGNOSIS — K5900 Constipation, unspecified: Secondary | ICD-10-CM | POA: Diagnosis not present

## 2019-08-20 NOTE — Patient Instructions (Signed)
Continue Miralax in the morning, Senokot, and Benefiber in the evening.  Please call me with any issues!  Have a wonderful Birthday!   I enjoyed seeing you again today! As you know, I value our relationship and want to provide genuine, compassionate, and quality care. I welcome your feedback. If you receive a survey regarding your visit,  I greatly appreciate you taking time to fill this out. See you next time!  Gelene Mink, PhD, ANP-BC [facility]astroenter[facility]

## 2019-08-20 NOTE — Progress Notes (Signed)
Referring Provider: Curlene Labrum, MD Primary Care Physician:  Curlene Labrum, MD Primary GI: Dr. Oneida Alar   Chief Complaint  Patient presents with  . Constipation    HPI:   [name] [name] is an 86 y.o. female presenting today with a history of chronic constipation, likely element of pelvic floor dysfunction, some improvement with Benefiber, Senokot, and Miralax.  Doesn't want to try prescription medication. Has tried Linzess in the past that was 400$. She feels like current regimen is better.   Miralax in the morning, 2 Senokot in the morning, 3 teaspoons in the evening of Benefiber. Mostly having a BM once a day, soft. Sometimes has trouble passing it and needing to strain. Having to take pain medication for back pain. Injections next week. Has lower abdominal cramping when taking Benefiber. Wants to keep taking this for now. Likes this regimen currently. She would like to return only as needed.   Still lives independently. Unable to work outside as she used to due to concerns for fall risk .    Past Medical History:  Diagnosis Date  . Chronic edema   . History of cardiovascular stress test    Negative dobutamine echo 6/13  . Osteoarthritis   . Paroxysmal atrial fibrillation (HCC)   . PFO (patent foramen ovale)   . Rosacea     Past Surgical History:  Procedure Laterality Date  . ANKLE SURGERY Left   . COLONOSCOPY  02/2018   Dr. Anthony Sar: adenomas  . HIP SURGERY Right   . INTRAMEDULLARY (IM) NAIL INTERTROCHANTERIC Left 05/23/2017   Procedure: Percutaneous Pinning of the left hip;  Surgeon: Nicholes Stairs, MD;  Location: [facility];  Service: Orthopedics;  Laterality: Left;  . PARTIAL HYSTERECTOMY     fibroid tumors   . RECTOCELE REPAIR    . REPLACEMENT TOTAL KNEE Right    X 3    Current Outpatient Medications  Medication Sig Dispense Refill  . CALCIUM-VITAMIN D PO Take 1 tablet by mouth daily.     . flecainide (TAMBOCOR) 50 MG tablet TAKE 1 TABLET BY  MOUTH TWICE DAILY 180 tablet 3  . HYDROcodone-acetaminophen (NORCO) 10-325 MG tablet Take 1 tablet by mouth as needed.    . metoprolol succinate (TOPROL-XL) 25 MG 24 hr tablet TAKE 1 TABLET BY MOUTH EVERY DAY 90 tablet 3  . Multiple Vitamin (MULTIVITAMIN) tablet Take 1 tablet by mouth daily.      . naproxen sodium (ALEVE) 220 MG tablet Take 220 mg by mouth as needed.    . polyethylene glycol (MIRALAX / GLYCOLAX) packet Take 17 g by mouth daily.    Marland Kitchen torsemide (DEMADEX) 20 MG tablet Take 2 tablets (40 mg total) by mouth daily. 180 tablet 3  . potassium chloride SA (K-DUR,KLOR-CON) 20 MEQ tablet Take 20 mEq by mouth daily.     No current facility-administered medications for this visit.    Allergies as of 08/20/2019 - Review Complete 08/20/2019  Allergen Reaction Noted  . Penicillins Hives 02/14/2013  . Cephalexin Nausea And Vomiting 04/28/2008  . Penicillin g Dermatitis and Rash 06/05/2017  . Digoxin  07/14/2009  . Latex  05/23/2017    Family History  Problem Relation Age of Onset  . Heart attack Mother   . Other Father        blood poisioning  . Heart attack Maternal Grandmother   . Colon cancer Neg Hx   . Colon polyps Neg Hx     Social History  Socioeconomic History  . Marital status: Widowed    Spouse name: Not on file  . Number of children: Not on file  . Years of education: Not on file  . Highest education level: Not on file  Occupational History  . Not on file  Tobacco Use  . Smoking status: Never Smoker  . Smokeless tobacco: Never Used  . Tobacco comment: does not smoke.   Substance and Sexual Activity  . Alcohol use: No  . Drug use: No  . Sexual activity: Not Currently  Other Topics Concern  . Not on file  Social History Narrative   Widowed.    (medical history: coumadin therapy ; follow-up by Dr. Sherril Croon )   Social Determinants of Health   Financial Resource Strain:   . Difficulty of Paying Living Expenses:   Food Insecurity:   . Worried About Community education officer in the Last Year:   . Barista in the Last Year:   Transportation Needs:   . Freight forwarder (Medical):   Marland Kitchen Lack of Transportation (Non-Medical):   Physical Activity:   . Days of Exercise per Week:   . Minutes of Exercise per Session:   Stress:   . Feeling of Stress :   Social Connections:   . Frequency of Communication with Friends and Family:   . Frequency of Social Gatherings with Friends and Family:   . Attends Religious Services:   . Active Member of Clubs or Organizations:   . Attends Banker Meetings:   Marland Kitchen Marital Status:     Review of Systems: Gen: Denies fever, chills, anorexia. Denies fatigue, weakness, weight loss.  CV: Denies chest pain, palpitations, syncope, peripheral edema, and claudication. Resp: Denies dyspnea at rest, cough, wheezing, coughing up blood, and pleurisy. GI: see HPI Derm: Denies rash, itching, dry skin Psych: Denies depression, anxiety, memory loss, confusion. No homicidal or suicidal ideation.  Heme: Denies bruising, bleeding, and enlarged lymph nodes.  Physical Exam: BP 135/65   Pulse 60   Temp (!) 97.3 F (36.3 C) (Temporal)   Ht 5' (1.524 m)   Wt 125 lb 3.2 oz (56.8 kg)   BMI 24.45 kg/m  General:   Alert and oriented. No distress noted. Pleasant and cooperative.  Head:  Normocephalic and atraumatic. Eyes:  Conjuctiva clear without scleral icterus. Mouth:  Mask in place Abdomen:  +BS, soft, non-tender and non-distended. Sitting in chair, limited exam Msk:  With kyphosis  Extremities:  Lymphedema bilateral lower extremities, right greater than left Neurologic:  Alert and  oriented x4 Psych:  Alert and cooperative. Normal mood and affect.  ASSESSMENT: [name] [name] is an 86 y.o. female presenting today with chronic constipation, previously requesting to avoid prescriptive agents, now taking a balanced regimen that is overall working well for her. Continues with Miralax in the morning, Senokot,  and Benefiber in the evening. She does note some mild cramping/gas with Benefiber, and we discussed decreasing this. She would like to continue for now. She is requesting to return as needed, which we will do. She was asked to call with any concerns in the meantime.   PLAN:   Continue current bowel regimen  Call if worsening constipation  Will see her back prn   Gelene Mink, PhD, [facility]

## 2019-08-21 NOTE — Progress Notes (Signed)
CC'ED TO PCP 

## 2019-11-06 ENCOUNTER — Other Ambulatory Visit: Payer: Self-pay | Admitting: Cardiology

## 2020-01-22 ENCOUNTER — Ambulatory Visit (INDEPENDENT_AMBULATORY_CARE_PROVIDER_SITE_OTHER): Payer: Medicare Other | Admitting: Cardiology

## 2020-01-22 ENCOUNTER — Encounter: Payer: Self-pay | Admitting: Cardiology

## 2020-01-22 ENCOUNTER — Other Ambulatory Visit: Payer: Self-pay

## 2020-01-22 VITALS — BP 120/70 | HR 63 | Ht 60.0 in | Wt 127.6 lb

## 2020-01-22 DIAGNOSIS — I480 Paroxysmal atrial fibrillation: Secondary | ICD-10-CM | POA: Diagnosis not present

## 2020-01-22 DIAGNOSIS — I890 Lymphedema, not elsewhere classified: Secondary | ICD-10-CM

## 2020-01-22 NOTE — Progress Notes (Signed)
Cardiology Office Note  Date: 01/22/2020   ID: [name] [name], DOB [birth year], MRN [id number]  PCP:  Juliette Alcide, MD  Cardiologist:  Nona Dell, MD Electrophysiologist:  None   Chief Complaint  Patient presents with  . Cardiac follow-up    History of Present Illness: [name] [name] is a 86 y.o. female last seen in February.  She presents for a routine visit, brought by a friend today.  She continues to have intermittent leg swelling related to lymphedema, follows in the wound clinic in [place], also has a home health nurse.  She has mechanical wraps in place today and otherwise continues on Demadex.  She does not report any progressive sense of palpitations and continues on flecainide along with Toprol-XL.  She has declined anticoagulation over time.  Reports having a fall about a month ago, lost her balance, no syncope. She uses a cane.  Past Medical History:  Diagnosis Date  . Chronic edema   . History of cardiovascular stress test    Negative dobutamine echo 6/13  . Osteoarthritis   . Paroxysmal atrial fibrillation (HCC)   . PFO (patent foramen ovale)   . Rosacea     Past Surgical History:  Procedure Laterality Date  . ANKLE SURGERY Left   . COLONOSCOPY  02/2018   Dr. Gabriel Cirri: adenomas  . HIP SURGERY Right   . INTRAMEDULLARY (IM) NAIL INTERTROCHANTERIC Left 05/23/2017   Procedure: Percutaneous Pinning of the left hip;  Surgeon: Yolonda Kida, MD;  Location: [facility];  Service: Orthopedics;  Laterality: Left;  . PARTIAL HYSTERECTOMY     fibroid tumors   . RECTOCELE REPAIR    . REPLACEMENT TOTAL KNEE Right    X 3    Current Outpatient Medications  Medication Sig Dispense Refill  . Ascorbic Acid (VITAMIN C) 1000 MG tablet Take 1,000 mg by mouth in the morning and at bedtime.    . flecainide (TAMBOCOR) 50 MG tablet TAKE 1 TABLET BY MOUTH TWICE DAILY 180 tablet 3  . HYDROcodone-acetaminophen (NORCO) 10-325 MG tablet Take 1 tablet by mouth as  needed.    . metoprolol succinate (TOPROL-XL) 25 MG 24 hr tablet TAKE 1 TABLET BY MOUTH EVERY DAY 90 tablet 3  . polyethylene glycol (MIRALAX / GLYCOLAX) packet Take 17 g by mouth daily.    Marland Kitchen torsemide (DEMADEX) 20 MG tablet TAKE TWO TABLETS BY MOUTH DAILY - STOP furosemide 180 tablet 1  . Zinc 50 MG TABS Take by mouth.    . naproxen sodium (ALEVE) 220 MG tablet Take 220 mg by mouth as needed. (Patient not taking: Reported on 01/22/2020)     No current facility-administered medications for this visit.   Allergies:  Penicillins, Cephalexin, Penicillin g, Digoxin, and Latex   ROS:   No orthopnea or PND.  Physical Exam: VS:  BP 120/70   Pulse 63   Ht 5' (1.524 m)   Wt 127 lb 9.6 oz (57.9 kg)   SpO2 98%   BMI 24.92 kg/m , BMI Body mass index is 24.92 kg/m.  Wt Readings from Last 3 Encounters:  01/22/20 127 lb 9.6 oz (57.9 kg)  08/20/19 125 lb 3.2 oz (56.8 kg)  07/03/19 122 lb (55.3 kg)    General: Elderly woman, appears comfortable at rest. HEENT: Conjunctiva and lids normal, wearing a mask. Neck: Supple, no elevated JVP or carotid bruits, no thyromegaly. Lungs: Clear to auscultation, nonlabored breathing at rest. Cardiac: Regular rate and rhythm, no S3, soft systolic murmur.  Extremities: Bilateral lymphedema with wraps in place.  ECG:  An ECG dated 12/19/2018 was personally reviewed today and demonstrated:  Sinus rhythm with decreased R wave progression and left anterior fascicular block.  Recent Labwork:  January 2021: BUN 25, creatinine 1.0, potassium 4.0, AST 21, ALT 12, cholesterol 233, triglycerides 166, HDL 70, LDL 134  Other Studies Reviewed Today:  Lexiscan Myoview 03/16/2016 [facility]): No myocardial perfusion defects to indicate scar or ischemia, LVEF 69%.  Assessment and Plan:  1.  Chronic lymphedema.  She is following in the wound clinic in [place], also has home health nurse to assist with wraps.  Continue Demadex as before.  Recommended that she follow-up with  Dr. Leandrew Koyanagi for repeat lab work.  Her last creatinine was 1.0 with normal potassium.  2.  Paroxysmal atrial fibrillation with CHA2DS2-VASc score of 3.  She reports good symptom control on flecainide and Toprol-XL.  She has declined anticoagulation over time.  Medication Adjustments/Labs and Tests Ordered: Current medicines are reviewed at length with the patient today.  Concerns regarding medicines are outlined above.   Tests Ordered: No orders of the defined types were placed in this encounter.   Medication Changes: No orders of the defined types were placed in this encounter.   Disposition:  Follow up 6 months in the [place] office.  Signed, Jonelle Sidle, MD, [facility] 01/22/2020 2:18 PM    [facility] at [facility] [address] [place], [place], [place] [address] Phone: [phone]; Fax: [phone]

## 2020-01-22 NOTE — Patient Instructions (Addendum)

## 2020-06-04 ENCOUNTER — Other Ambulatory Visit: Payer: Self-pay | Admitting: Cardiology

## 2020-07-16 ENCOUNTER — Other Ambulatory Visit: Payer: Self-pay | Admitting: Cardiology

## 2020-08-06 ENCOUNTER — Ambulatory Visit: Payer: Medicare Other | Admitting: Cardiology

## 2020-09-03 ENCOUNTER — Encounter: Payer: Self-pay | Admitting: Cardiology

## 2020-09-03 ENCOUNTER — Other Ambulatory Visit: Payer: Self-pay

## 2020-09-03 ENCOUNTER — Ambulatory Visit (INDEPENDENT_AMBULATORY_CARE_PROVIDER_SITE_OTHER): Payer: Medicare Other | Admitting: Cardiology

## 2020-09-03 VITALS — BP 148/78 | HR 80 | Ht 60.0 in | Wt 132.0 lb

## 2020-09-03 DIAGNOSIS — I480 Paroxysmal atrial fibrillation: Secondary | ICD-10-CM | POA: Diagnosis not present

## 2020-09-03 DIAGNOSIS — I890 Lymphedema, not elsewhere classified: Secondary | ICD-10-CM

## 2020-09-03 NOTE — Progress Notes (Signed)
Cardiology Office Note  Date: 09/03/2020   ID: [name] [name], DOB [birth year], MRN [id number]  PCP:  Juliette Alcide, MD  Cardiologist:  Nona Dell, MD Electrophysiologist:  None   Chief Complaint  Patient presents with  . Cardiac follow-up    History of Present Illness: [name] [name] is a 87 y.o. female last seen in August 2021.  She is here for a routine visit.  Records indicate recent ER visit at [facility] in early April following a mechanical fall.  No obvious acute injury by several imaging studies including chest x-ray, head CT, and chest CT. Lab work is noted below.  She states that she was told that she had a bruise of her right ribs but no fracture, is starting to feel better slowly.  We went over her medications.  She is has switched from [facility] to Lasix 40 mg daily since our last encounter.  She tells me that the Demadex seem to be "too strong."  I personally reviewed her ECG today which shows sinus rhythm with left anterior fascicular block, increased voltage, decreased R wave progression.  Past Medical History:  Diagnosis Date  . Chronic edema   . History of cardiovascular stress test    Negative dobutamine echo 6/13  . Osteoarthritis   . Paroxysmal atrial fibrillation (HCC)   . PFO (patent foramen ovale)   . Rosacea     Past Surgical History:  Procedure Laterality Date  . ANKLE SURGERY Left   . COLONOSCOPY  02/2018   Dr. Gabriel Cirri: adenomas  . HIP SURGERY Right   . INTRAMEDULLARY (IM) NAIL INTERTROCHANTERIC Left 05/23/2017   Procedure: Percutaneous Pinning of the left hip;  Surgeon: Yolonda Kida, MD;  Location: [facility];  Service: Orthopedics;  Laterality: Left;  . PARTIAL HYSTERECTOMY     fibroid tumors   . RECTOCELE REPAIR    . REPLACEMENT TOTAL KNEE Right    X 3    Current Outpatient Medications  Medication Sig Dispense Refill  . Ascorbic Acid (VITAMIN C) 1000 MG tablet Take 1,000 mg by mouth in the morning and at bedtime.     . flecainide (TAMBOCOR) 50 MG tablet TAKE 1 TABLET BY MOUTH TWICE DAILY 180 tablet 3  . HYDROcodone-acetaminophen (NORCO) 10-325 MG tablet Take 1 tablet by mouth as needed.    . metoprolol succinate (TOPROL-XL) 25 MG 24 hr tablet TAKE 1 TABLET BY MOUTH EVERY DAY 90 tablet 3  . naproxen sodium (ALEVE) 220 MG tablet Take 220 mg by mouth as needed.    . polyethylene glycol (MIRALAX / GLYCOLAX) packet Take 17 g by mouth daily.    Marland Kitchen torsemide (DEMADEX) 20 MG tablet TAKE TWO TABLETS BY MOUTH DAILY - STOP furosemide 180 tablet 1  . Zinc 50 MG TABS Take by mouth.     No current facility-administered medications for this visit.   Allergies:  Penicillins, Cephalexin, Penicillin g, Digoxin, and Latex   ROS: No palpitations or frank syncope.  Physical Exam: VS:  BP (!) 148/78   Pulse 80   Ht 5' (1.524 m)   Wt 132 lb (59.9 kg)   SpO2 98%   BMI 25.78 kg/m , BMI Body mass index is 25.78 kg/m.  Wt Readings from Last 3 Encounters:  09/03/20 132 lb (59.9 kg)  01/22/20 127 lb 9.6 oz (57.9 kg)  08/20/19 125 lb 3.2 oz (56.8 kg)    General: Elderly woman, appears comfortable at rest. HEENT: Conjunctiva and lids normal, wearing a  mask. Neck: Supple, no elevated JVP or carotid bruits, no thyromegaly. Lungs: Clear to auscultation, nonlabored breathing at rest. Cardiac: Regular rate and rhythm, no S3, soft systolic murmur, no pericardial rub. Extremities: Improved lymphedema, venous stasis.  ECG:  An ECG dated 12/19/2018 was personally reviewed today and demonstrated:  Sinus rhythm with decreased R wave progression and left anterior fascicular block.  Recent Labwork:  April 2022: Hemoglobin 14.9, platelets 216, BUN 22, creatinine 0.88, potassium 3.6, AST 23, ALT 21  Other Studies Reviewed Today:  Lexiscan Myoview 03/16/2016 [facility]): No myocardial perfusion defects to indicate scar or ischemia, LVEF 69%.  Assessment and Plan:  1.  Paroxysmal atrial fibrillation with CHA2DS2-VASc score of  3.  She declines anticoagulation.  No palpitations and in sinus rhythm today.  We will continue flecainide and Toprol-XL.  2.  Chronic lymphedema, now on Lasix and with necessary wraps for mechanical compression as needed.  Medication Adjustments/Labs and Tests Ordered: Current medicines are reviewed at length with the patient today.  Concerns regarding medicines are outlined above.   Tests Ordered: Orders Placed This Encounter  Procedures  . EKG 12-Lead    Medication Changes: No orders of the defined types were placed in this encounter.   Disposition:  Follow up 6 months in the [place] office.  Signed, Jonelle Sidle, MD, [facility] 09/03/2020 1:24 PM    [facility] at [facility] [address] [place], [place], [place] [address] Phone: [phone]; Fax: [phone]

## 2020-09-03 NOTE — Patient Instructions (Addendum)

## 2021-05-31 ENCOUNTER — Other Ambulatory Visit: Payer: Self-pay | Admitting: Cardiology

## 2021-07-03 ENCOUNTER — Other Ambulatory Visit: Payer: Self-pay | Admitting: Cardiology

## 2021-07-21 NOTE — Progress Notes (Incomplete)
[facility] - Clinic Note  07/25/2021     CHIEF COMPLAINT Patient presents for No chief complaint on file.   HISTORY OF PRESENT ILLNESS: [name] [name] is a 88 y.o. female who presents to the clinic today for:     Referring physician: Juliette Alcide, MD [address] [place],  [place] [address]  HISTORICAL INFORMATION:   Selected notes from the MEDICAL RECORD NUMBER Referred by Dr. Nedra Hai:  Ocular Hx- PMH-    CURRENT MEDICATIONS: No current outpatient medications on file. (Ophthalmic Drugs)   No current facility-administered medications for this visit. (Ophthalmic Drugs)   Current Outpatient Medications (Other)  Medication Sig   Ascorbic Acid (VITAMIN C) 1000 MG tablet Take 1,000 mg by mouth in the morning and at bedtime.   flecainide (TAMBOCOR) 50 MG tablet TAKE 1 TABLET BY MOUTH TWICE DAILY   furosemide (LASIX) 40 MG tablet Take 40 mg by mouth.   HYDROcodone-acetaminophen (NORCO) 10-325 MG tablet Take 1 tablet by mouth as needed.   metoprolol succinate (TOPROL-XL) 25 MG 24 hr tablet TAKE 1 TABLET BY MOUTH EVERY DAY   naproxen sodium (ALEVE) 220 MG tablet Take 220 mg by mouth as needed.   polyethylene glycol (MIRALAX / GLYCOLAX) packet Take 17 g by mouth daily.   Zinc 50 MG TABS Take by mouth.   No current facility-administered medications for this visit. (Other)      REVIEW OF SYSTEMS:    ALLERGIES Allergies  Allergen Reactions   Penicillins Hives   Cephalexin Nausea And Vomiting    REACTION: nausea REACTION: nausea   Penicillin G Dermatitis and Rash   Digoxin     REACTION: rash on legs   Latex     PAST MEDICAL HISTORY Past Medical History:  Diagnosis Date   Chronic edema    History of cardiovascular stress test    Negative dobutamine echo 6/13   Osteoarthritis    Paroxysmal atrial fibrillation (HCC)    PFO (patent foramen ovale)    Rosacea    Past Surgical History:  Procedure Laterality Date   ANKLE SURGERY Left     COLONOSCOPY  02/2018   Dr. Gabriel Cirri: adenomas   HIP SURGERY Right    INTRAMEDULLARY (IM) NAIL INTERTROCHANTERIC Left 05/23/2017   Procedure: Percutaneous Pinning of the left hip;  Surgeon: Yolonda Kida, MD;  Location: [facility];  Service: Orthopedics;  Laterality: Left;   PARTIAL HYSTERECTOMY     fibroid tumors    RECTOCELE REPAIR     REPLACEMENT TOTAL KNEE Right    X 3    FAMILY HISTORY Family History  Problem Relation Age of Onset   Heart attack Mother    Other Father        blood poisioning   Heart attack Maternal Grandmother    Colon cancer Neg Hx    Colon polyps Neg Hx     SOCIAL HISTORY Social History   Tobacco Use   Smoking status: Never   Smokeless tobacco: Never   Tobacco comments:    does not smoke.   Vaping Use   Vaping Use: Never used  Substance Use Topics   Alcohol use: No   Drug use: No         OPHTHALMIC EXAM:  Not recorded     IMAGING AND PROCEDURES  Imaging and Procedures for 07/25/2021           ASSESSMENT/PLAN:  No diagnosis found.  1.  2.  3.  Ophthalmic  Meds Ordered this visit:  No orders of the defined types were placed in this encounter.      No follow-ups on file.  There are no Patient Instructions on file for this visit.   Explained the diagnoses, plan, and follow up with the patient and they expressed understanding.  Patient expressed understanding of the importance of proper follow up care.   This document serves as a record of services personally performed by Karie Chimera, MD, PhD. It was created on their behalf by Annalee Genta, COMT. The creation of this record is the provider's dictation and/or activities during the visit.  Electronically signed by: Annalee Genta, COMT 07/21/21 2:59 PM    Karie Chimera, M.D., Ph.D. Diseases & Surgery of the Retina and Vitreous [facility] @TODAY @     Abbreviations: M myopia (nearsighted); A astigmatism; H hyperopia (farsighted); P  presbyopia; Mrx spectacle prescription;  CTL contact lenses; OD right eye; OS left eye; OU both eyes  XT exotropia; ET esotropia; PEK punctate epithelial keratitis; PEE punctate epithelial erosions; DES dry eye syndrome; MGD meibomian gland dysfunction; ATs artificial tears; PFAT's preservative free artificial tears; NSC nuclear sclerotic cataract; PSC posterior subcapsular cataract; ERM epi-retinal membrane; PVD posterior vitreous detachment; RD retinal detachment; DM diabetes mellitus; DR diabetic retinopathy; NPDR non-proliferative diabetic retinopathy; PDR proliferative diabetic retinopathy; CSME clinically significant macular edema; DME diabetic macular edema; dbh dot blot hemorrhages; CWS cotton wool spot; POAG primary open angle glaucoma; C/D cup-to-disc ratio; HVF humphrey visual field; GVF goldmann visual field; OCT optical coherence tomography; IOP intraocular pressure; BRVO Branch retinal vein occlusion; CRVO central retinal vein occlusion; CRAO central retinal artery occlusion; BRAO branch retinal artery occlusion; RT retinal tear; SB scleral buckle; PPV pars plana vitrectomy; VH Vitreous hemorrhage; PRP panretinal laser photocoagulation; IVK intravitreal kenalog; VMT vitreomacular traction; MH Macular hole;  NVD neovascularization of the disc; NVE neovascularization elsewhere; AREDS age related eye disease study; ARMD age related macular degeneration; POAG primary open angle glaucoma; EBMD epithelial/anterior basement membrane dystrophy; ACIOL anterior chamber intraocular lens; IOL intraocular lens; PCIOL posterior chamber intraocular lens; Phaco/IOL phacoemulsification with intraocular lens placement; PRK photorefractive keratectomy; LASIK laser assisted in situ keratomileusis; HTN hypertension; DM diabetes mellitus; COPD chronic obstructive pulmonary disease

## 2021-07-25 ENCOUNTER — Ambulatory Visit (INDEPENDENT_AMBULATORY_CARE_PROVIDER_SITE_OTHER): Payer: Medicare Other | Admitting: Ophthalmology

## 2021-07-25 ENCOUNTER — Other Ambulatory Visit: Payer: Self-pay

## 2021-07-25 ENCOUNTER — Encounter (INDEPENDENT_AMBULATORY_CARE_PROVIDER_SITE_OTHER): Payer: Self-pay | Admitting: Ophthalmology

## 2021-07-25 VITALS — BP 195/80

## 2021-07-25 DIAGNOSIS — H353132 Nonexudative age-related macular degeneration, bilateral, intermediate dry stage: Secondary | ICD-10-CM | POA: Diagnosis not present

## 2021-07-25 DIAGNOSIS — H3563 Retinal hemorrhage, bilateral: Secondary | ICD-10-CM | POA: Diagnosis not present

## 2021-07-25 DIAGNOSIS — H35033 Hypertensive retinopathy, bilateral: Secondary | ICD-10-CM | POA: Diagnosis not present

## 2021-07-25 DIAGNOSIS — Z961 Presence of intraocular lens: Secondary | ICD-10-CM

## 2021-07-25 DIAGNOSIS — I10 Essential (primary) hypertension: Secondary | ICD-10-CM

## 2021-07-25 NOTE — Progress Notes (Signed)
[facility] - Clinic Note  07/25/2021     CHIEF COMPLAINT Patient presents for Retina Evaluation   HISTORY OF PRESENT ILLNESS: [name] [name] is a 88 y.o. female who presents to the clinic today for:   HPI     Retina Evaluation   In both eyes.  I, the attending physician,  performed the HPI with the patient and updated documentation appropriately.        Comments   Retina eval per Dr. Earlene Plater for retinal heme OU.  Patient hasn't notices any changes in eyes.  Using Systane TID OU. Pseudo OU, YAG OU.      Last edited by Rennis Chris, MD on 07/25/2021  1:58 PM.    Pt is here on the referral of Dr. Earlene Plater for concern of retinal hemes OU, pt states she is on A Fib, but had to go off her blood thinners bc she had 2 bleeds, one in her eye and one in her stomach, she states her BP has been high for about a year and she is on metoprolol and flecainide, pt states Dr. Earlene Plater has not told her she has dry ARMD  Referring physician: Desiree Lucy, OD [address] [place],  [place] [address]  HISTORICAL INFORMATION:   Selected notes from the MEDICAL RECORD NUMBER Referred by Dr. Nedra Hai:  Ocular Hx- PMH-    CURRENT MEDICATIONS: No current outpatient medications on file. (Ophthalmic Drugs)   No current facility-administered medications for this visit. (Ophthalmic Drugs)   Current Outpatient Medications (Other)  Medication Sig   doxycycline (ADOXA) 50 MG tablet Take 50 mg by mouth daily.   flecainide (TAMBOCOR) 50 MG tablet TAKE 1 TABLET BY MOUTH TWICE DAILY   furosemide (LASIX) 40 MG tablet Take 40 mg by mouth.   metoprolol succinate (TOPROL-XL) 25 MG 24 hr tablet TAKE 1 TABLET BY MOUTH EVERY DAY   naproxen sodium (ALEVE) 220 MG tablet Take 220 mg by mouth as needed.   Ascorbic Acid (VITAMIN C) 1000 MG tablet Take 1,000 mg by mouth in the morning and at bedtime. (Patient not taking: Reported on 07/25/2021)   HYDROcodone-acetaminophen (NORCO) 10-325 MG tablet  Take 1 tablet by mouth as needed. (Patient not taking: Reported on 07/25/2021)   polyethylene glycol (MIRALAX / GLYCOLAX) packet Take 17 g by mouth daily. (Patient not taking: Reported on 07/25/2021)   Zinc 50 MG TABS Take by mouth. (Patient not taking: Reported on 07/25/2021)   No current facility-administered medications for this visit. (Other)      REVIEW OF SYSTEMS: ROS   Positive for: Musculoskeletal, Cardiovascular, Eyes Negative for: Constitutional, Gastrointestinal, Neurological, Skin, Genitourinary, HENT, Endocrine, Respiratory, Psychiatric, Allergic/Imm, Heme/Lymph Last edited by Joni Reining, COA on 07/25/2021  1:39 PM.       ALLERGIES Allergies  Allergen Reactions   Penicillins Hives   Cephalexin Nausea And Vomiting    REACTION: nausea REACTION: nausea   Penicillin G Dermatitis and Rash   Digoxin     REACTION: rash on legs   Latex     PAST MEDICAL HISTORY Past Medical History:  Diagnosis Date   Chronic edema    History of cardiovascular stress test    Negative dobutamine echo 6/13   Osteoarthritis    Paroxysmal atrial fibrillation (HCC)    PFO (patent foramen ovale)    Rosacea    Past Surgical History:  Procedure Laterality Date   ANKLE SURGERY Left    COLONOSCOPY  02/2018  Dr. Gabriel CirrieMason: adenomas   HIP SURGERY Right    INTRAMEDULLARY (IM) NAIL INTERTROCHANTERIC Left 05/23/2017   Procedure: Percutaneous Pinning of the left hip;  Surgeon: Yolonda Kidaogers, Jason Patrick, MD;  Location: [facility];  Service: Orthopedics;  Laterality: Left;   PARTIAL HYSTERECTOMY     fibroid tumors    RECTOCELE REPAIR     REPLACEMENT TOTAL KNEE Right    X 3    FAMILY HISTORY Family History  Problem Relation Age of Onset   Heart attack Mother    Other Father        blood poisioning   Heart attack Maternal Grandmother    Colon cancer Neg Hx    Colon polyps Neg Hx     SOCIAL HISTORY Social History   Tobacco Use   Smoking status: Never   Smokeless tobacco: Never   Tobacco  comments:    does not smoke.   Vaping Use   Vaping Use: Never used  Substance Use Topics   Alcohol use: No   Drug use: No         OPHTHALMIC EXAM:  Base Eye Exam     Visual Acuity (Snellen - Linear)       Right Left   Dist cc 20/30 20/30   Dist ph cc NI NI    Correction: Glasses         Tonometry (Tonopen, 1:51 PM)       Right Left   Pressure 18 17         Pupils       Dark Light Shape React APD   Right 3 2 Round Brisk None   Left 3 2 Round Brisk None         Visual Fields (Counting fingers)       Left Right    Full Full         Extraocular Movement       Right Left    Full Full         Neuro/Psych     Oriented x3: Yes   Mood/Affect: Normal         Dilation     Both eyes: 1.0% Mydriacyl, 2.5% Phenylephrine @ 1:51 PM           Slit Lamp and Fundus Exam     Slit Lamp Exam       Right Left   Lids/Lashes Dermatochalasis - upper lid, Meibomian gland dysfunction Dermatochalasis - upper lid, Meibomian gland dysfunction   Conjunctiva/Sclera White and quiet White and quiet   Cornea trace tear film debris, well healed cataract wound trace tear film debris, well healed cataract wound   Anterior Chamber Deep and quiet Deep and quiet   Iris Round and moderately dilated Round and moderately dilated to 4.7075mm   Lens PC IOL in good position with open PC PC IOL in good position with open PC   Anterior Vitreous Mild syneresis Mild syneresis         Fundus Exam       Right Left   Disc mild Pallor, Sharp rim mild Pallor, Sharp rim, +cupping   C/D Ratio 0.5 0.7   Macula Flat, Blunted foveal reflex, scattered DBH, no edema Flat, Blunted foveal reflex, scattered DBH, no edema   Vessels attenuated, Copper wiring, mild tortuosity attenuated, Copper wiring, mild tortuosity   Periphery Attached, scattered DBH greatest posteriorly Attached, scattered DBH greatest posteriorly           Refraction  Wearing Rx       Sphere Cylinder  Axis Add   Right -2.25 +1.75 010 +2.50   Left -1.25 +1.50 179 +2.50         Manifest Refraction       Sphere Cylinder Axis Dist VA   Right -2.00 +1.75 010 NI   Left -1.25 +1.50 179 NI            IMAGING AND PROCEDURES  Imaging and Procedures for 07/25/2021  OCT, Retina - OU - Both Eyes       Right Eye Quality was good. Central Foveal Thickness: 254. Progression has no prior data. Findings include normal foveal contour, no SRF, no IRF, retinal drusen .   Left Eye Quality was good. Central Foveal Thickness: 263. Progression has no prior data. Findings include normal foveal contour, no IRF, no SRF, retinal drusen .   Notes *Images captured and stored on drive  Diagnosis / Impression:  NFP; no IRF/SRF OU +drusen OU  Clinical management:  See below  Abbreviations: NFP - Normal foveal profile. CME - cystoid macular edema. PED - pigment epithelial detachment. IRF - intraretinal fluid. SRF - subretinal fluid. EZ - ellipsoid zone. ERM - epiretinal membrane. ORA - outer retinal atrophy. ORT - outer retinal tubulation. SRHM - subretinal hyper-reflective material. IRHM - intraretinal hyper-reflective material      Color Fundus Photography Optos - OU - Both Eyes       Right Eye Progression has no prior data. Disc findings include pallor. Macula : hemorrhage, drusen. Vessels : attenuated, tortuous vessels. Periphery : hemorrhage.   Left Eye Progression has no prior data. Disc findings include normal observations. Macula : hemorrhage, drusen. Vessels : attenuated, tortuous vessels. Periphery : hemorrhage.   Notes **Images stored on drive**  Impression: Scattered DBH OU +drusen OU              ASSESSMENT/PLAN:    ICD-10-CM   1. Retinal hemorrhage of both eyes  H35.63 Color Fundus Photography Optos - OU - Both Eyes    2. Essential hypertension  I10     3. Hypertensive retinopathy of both eyes  H35.033     4. Intermediate stage nonexudative age-related  macular degeneration of both eyes  H35.3132 OCT, Retina - OU - Both Eyes    5. Pseudophakia of both eyes  Z96.1      1-3. Hypertensive retinopathy with retinal hemorrhages OU (OD>OS) - exam shows scattered dot and blot hemorrhages OU (OD > OS) - BP in office 195/80 (2.27.23)  - pt on metoprolol and flecainide for Afib - discussed importance of tight BP control - monitor -- no retinal or ophthalmic interventions indicated or recommended  - will send note to Cardiologist Diona Browner) and PCP (Burdine) -- pt may benefit from adjustments in BP meds - f/u 6-8 weeks, DFE, OCT  4. Age related macular degeneration, non-exudative, OU - The incidence, anatomy, and pathology of dry AMD, risk of progression, and the AREDS and AREDS 2 studies including smoking risks discussed with patient.  - Recommend amsler grid monitoring  - f/u 3 months, DFE, OCT  5. Pseudophakia OU  - s/p CE/IOL OU  - IOLs in good position, doing well  - monitor  Ophthalmic Meds Ordered this visit:  No orders of the defined types were placed in this encounter.    Return for f/u 6-8 weeks, retinal hemes OU.  There are no Patient Instructions on file for this visit.   Explained the diagnoses, plan, and  follow up with the patient and they expressed understanding.  Patient expressed understanding of the importance of proper follow up care.    This document serves as a record of services personally performed by Karie Chimera, MD, PhD. It was created on their behalf by Glee Arvin. Manson Passey, OA an ophthalmic technician. The creation of this record is the provider's dictation and/or activities during the visit.    Electronically signed by: Glee Arvin. Manson Passey, [place] 02.27.2023 3:00 PM  Karie Chimera, M.D., Ph.D. Diseases & Surgery of the Retina and Vitreous [facility]  I have reviewed the above documentation for accuracy and completeness, and I agree with the above. Karie Chimera, M.D., Ph.D. 07/25/21 3:00  PM  Abbreviations: M myopia (nearsighted); A astigmatism; H hyperopia (farsighted); P presbyopia; Mrx spectacle prescription;  CTL contact lenses; OD right eye; OS left eye; OU both eyes  XT exotropia; ET esotropia; PEK punctate epithelial keratitis; PEE punctate epithelial erosions; DES dry eye syndrome; MGD meibomian gland dysfunction; ATs artificial tears; PFAT's preservative free artificial tears; NSC nuclear sclerotic cataract; PSC posterior subcapsular cataract; ERM epi-retinal membrane; PVD posterior vitreous detachment; RD retinal detachment; DM diabetes mellitus; DR diabetic retinopathy; NPDR non-proliferative diabetic retinopathy; PDR proliferative diabetic retinopathy; CSME clinically significant macular edema; DME diabetic macular edema; dbh dot blot hemorrhages; CWS cotton wool spot; POAG primary open angle glaucoma; C/D cup-to-disc ratio; HVF humphrey visual field; GVF goldmann visual field; OCT optical coherence tomography; IOP intraocular pressure; BRVO Branch retinal vein occlusion; CRVO central retinal vein occlusion; CRAO central retinal artery occlusion; BRAO branch retinal artery occlusion; RT retinal tear; SB scleral buckle; PPV pars plana vitrectomy; VH Vitreous hemorrhage; PRP panretinal laser photocoagulation; IVK intravitreal kenalog; VMT vitreomacular traction; MH Macular hole;  NVD neovascularization of the disc; NVE neovascularization elsewhere; AREDS age related eye disease study; ARMD age related macular degeneration; POAG primary open angle glaucoma; EBMD epithelial/anterior basement membrane dystrophy; ACIOL anterior chamber intraocular lens; IOL intraocular lens; PCIOL posterior chamber intraocular lens; Phaco/IOL phacoemulsification with intraocular lens placement; PRK photorefractive keratectomy; LASIK laser assisted in situ keratomileusis; HTN hypertension; DM diabetes mellitus; COPD chronic obstructive pulmonary disease

## 2021-09-06 ENCOUNTER — Encounter: Payer: Self-pay | Admitting: *Deleted

## 2021-09-13 ENCOUNTER — Ambulatory Visit: Payer: Medicare Other | Admitting: Cardiology

## 2021-09-13 NOTE — Progress Notes (Deleted)
Cardiology Office Note  Date: 09/13/2021   ID: [name] [name], DOB February 21, 1928, MRN [id number]  PCP:  Juliette Alcide, MD  Cardiologist:  Nona Dell, MD Electrophysiologist:  None   No chief complaint on file.   History of Present Illness: [name] [name] is a 88 y.o. female last seen in April 2022.  Past Medical History:  Diagnosis Date   Chronic edema    History of cardiovascular stress test    Negative dobutamine echo 6/13   Osteoarthritis    Paroxysmal atrial fibrillation (HCC)    PFO (patent foramen ovale)    Rosacea     Past Surgical History:  Procedure Laterality Date   ANKLE SURGERY Left    COLONOSCOPY  02/2018   Dr. Gabriel Cirri: adenomas   HIP SURGERY Right    INTRAMEDULLARY (IM) NAIL INTERTROCHANTERIC Left 05/23/2017   Procedure: Percutaneous Pinning of the left hip;  Surgeon: Yolonda Kida, MD;  Location: [facility];  Service: Orthopedics;  Laterality: Left;   PARTIAL HYSTERECTOMY     fibroid tumors    RECTOCELE REPAIR     REPLACEMENT TOTAL KNEE Right    X 3    Current Outpatient Medications  Medication Sig Dispense Refill   Ascorbic Acid (VITAMIN C) 1000 MG tablet Take 1,000 mg by mouth in the morning and at bedtime. (Patient not taking: Reported on 07/25/2021)     doxycycline (ADOXA) 50 MG tablet Take 50 mg by mouth daily.     flecainide (TAMBOCOR) 50 MG tablet TAKE 1 TABLET BY MOUTH TWICE DAILY 180 tablet 3   furosemide (LASIX) 40 MG tablet Take 40 mg by mouth.     HYDROcodone-acetaminophen (NORCO) 10-325 MG tablet Take 1 tablet by mouth as needed. (Patient not taking: Reported on 07/25/2021)     metoprolol succinate (TOPROL-XL) 25 MG 24 hr tablet TAKE 1 TABLET BY MOUTH EVERY DAY 90 tablet 3   naproxen sodium (ALEVE) 220 MG tablet Take 220 mg by mouth as needed.     polyethylene glycol (MIRALAX / GLYCOLAX) packet Take 17 g by mouth daily. (Patient not taking: Reported on 07/25/2021)     Zinc 50 MG TABS Take by mouth. (Patient not taking:  Reported on 07/25/2021)     No current facility-administered medications for this visit.   Allergies:  Penicillins, Cephalexin, Penicillin g, Digoxin, and Latex   Social History: The patient  reports that she has never smoked. She has never used smokeless tobacco. She reports that she does not drink alcohol and does not use drugs.   Family History: The patient's family history includes Heart attack in her maternal grandmother and mother; Other in her father.   ROS:  Please see the history of present illness. Otherwise, complete review of systems is positive for {NONE DEFAULTED:18576}.  All other systems are reviewed and negative.   Physical Exam: VS:  There were no vitals taken for this visit., BMI There is no height or weight on file to calculate BMI.  Wt Readings from Last 3 Encounters:  09/03/20 132 lb (59.9 kg)  01/22/20 127 lb 9.6 oz (57.9 kg)  08/20/19 125 lb 3.2 oz (56.8 kg)    General: Patient appears comfortable at rest. HEENT: Conjunctiva and lids normal, oropharynx clear with moist mucosa. Neck: Supple, no elevated JVP or carotid bruits, no thyromegaly. Lungs: Clear to auscultation, nonlabored breathing at rest. Cardiac: Regular rate and rhythm, no S3 or significant systolic murmur, no pericardial rub. Abdomen: Soft, nontender, no hepatomegaly, bowel sounds  present, no guarding or rebound. Extremities: No pitting edema, distal pulses 2+. Skin: Warm and dry. Musculoskeletal: No kyphosis. Neuropsychiatric: Alert and oriented x3, affect grossly appropriate.  ECG:  An ECG dated 04/01/2021 was personally reviewed today and demonstrated:  Sinus rhythm with incomplete right bundle branch block and left anterior fascicular block, increased voltage.  Recent Labwork:  April 2023: Potassium 4.4, BUN 29, creatinine 0.98, AST 22, ALT 20, hemoglobin 13.3, platelets 252  Other Studies Reviewed Today:  No interval cardiac testing for review today.  Assessment and  Plan:    Medication Adjustments/Labs and Tests Ordered: Current medicines are reviewed at length with the patient today.  Concerns regarding medicines are outlined above.   Tests Ordered: No orders of the defined types were placed in this encounter.   Medication Changes: No orders of the defined types were placed in this encounter.   Disposition:  Follow up {follow up:15908}  Signed, Jonelle Sidle, MD, [facility] 09/13/2021 11:52 AM    [facility] at [facility] [address] [place], [place], [place] [address] Phone: [phone]; Fax: [phone]

## 2021-09-20 ENCOUNTER — Encounter (INDEPENDENT_AMBULATORY_CARE_PROVIDER_SITE_OTHER): Payer: Medicare Other | Admitting: Ophthalmology

## 2021-09-21 NOTE — Progress Notes (Shared)
?[facility] - Clinic Note ? ?10/04/2021 ? ?  ? ?CHIEF COMPLAINT ?Patient presents for No chief complaint on file. ? ? ?HISTORY OF PRESENT ILLNESS: ?[name] [name] is a 88 y.o. female who presents to the clinic today for:  ? ? ?Pt is here on the referral of Dr. Earlene Plater for concern of retinal hemes OU, pt states she is on A Fib, but had to go off her blood thinners bc she had 2 bleeds, one in her eye and one in her stomach, she states her BP has been high for about a year and she is on metoprolol and flecainide, pt states Dr. Earlene Plater has not told her she has dry ARMD ? ?Referring physician: ?Juliette Alcide, MD ?[address] ?[place],  [place] [address] ? ?HISTORICAL INFORMATION:  ? ?Selected notes from the MEDICAL RECORD NUMBER ?Referred by Dr. Marland KitchenLEE:  ?Ocular Hx- ?PMH- ?  ? ?CURRENT MEDICATIONS: ?No current outpatient medications on file. (Ophthalmic Drugs)  ? ?No current facility-administered medications for this visit. (Ophthalmic Drugs)  ? ?Current Outpatient Medications (Other)  ?Medication Sig  ? Ascorbic Acid (VITAMIN C) 1000 MG tablet Take 1,000 mg by mouth in the morning and at bedtime. (Patient not taking: Reported on 07/25/2021)  ? doxycycline (ADOXA) 50 MG tablet Take 50 mg by mouth daily.  ? flecainide (TAMBOCOR) 50 MG tablet TAKE 1 TABLET BY MOUTH TWICE DAILY  ? furosemide (LASIX) 40 MG tablet Take 40 mg by mouth.  ? HYDROcodone-acetaminophen (NORCO) 10-325 MG tablet Take 1 tablet by mouth as needed. (Patient not taking: Reported on 07/25/2021)  ? metoprolol succinate (TOPROL-XL) 25 MG 24 hr tablet TAKE 1 TABLET BY MOUTH EVERY DAY  ? naproxen sodium (ALEVE) 220 MG tablet Take 220 mg by mouth as needed.  ? polyethylene glycol (MIRALAX / GLYCOLAX) packet Take 17 g by mouth daily. (Patient not taking: Reported on 07/25/2021)  ? Zinc 50 MG TABS Take by mouth. (Patient not taking: Reported on 07/25/2021)  ? ?No current facility-administered medications for this visit. (Other)  ? ? ? ? ?REVIEW OF  SYSTEMS: ? ? ? ? ?ALLERGIES ?Allergies  ?Allergen Reactions  ? Penicillins Hives  ? Cephalexin Nausea And Vomiting  ?  REACTION: nausea ?REACTION: nausea  ? Penicillin G Dermatitis and Rash  ? Digoxin   ?  REACTION: rash on legs  ? Latex   ? ? ?PAST MEDICAL HISTORY ?Past Medical History:  ?Diagnosis Date  ? Chronic edema   ? History of cardiovascular stress test   ? Negative dobutamine echo 6/13  ? Osteoarthritis   ? Paroxysmal atrial fibrillation (HCC)   ? PFO (patent foramen ovale)   ? Rosacea   ? ?Past Surgical History:  ?Procedure Laterality Date  ? ANKLE SURGERY Left   ? COLONOSCOPY  02/2018  ? Dr. Gabriel Cirri: adenomas  ? HIP SURGERY Right   ? INTRAMEDULLARY (IM) NAIL INTERTROCHANTERIC Left 05/23/2017  ? Procedure: Percutaneous Pinning of the left hip;  Surgeon: Yolonda Kida, MD;  Location: [facility];  Service: Orthopedics;  Laterality: Left;  ? PARTIAL HYSTERECTOMY    ? fibroid tumors   ? RECTOCELE REPAIR    ? REPLACEMENT TOTAL KNEE Right   ? X 3  ? ? ?FAMILY HISTORY ?Family History  ?Problem Relation Age of Onset  ? Heart attack Mother   ? Other Father   ?     blood poisioning  ? Heart attack Maternal Grandmother   ? Colon cancer Neg Hx   ?  Colon polyps Neg Hx   ? ? ?SOCIAL HISTORY ?Social History  ? ?Tobacco Use  ? Smoking status: Never  ? Smokeless tobacco: Never  ? Tobacco comments:  ?  does not smoke.   ?Vaping Use  ? Vaping Use: Never used  ?Substance Use Topics  ? Alcohol use: No  ? Drug use: No  ? ?  ? ?  ? ?OPHTHALMIC EXAM: ? ?Not recorded ?  ? ? ?IMAGING AND PROCEDURES  ?Imaging and Procedures for 10/04/2021 ? ? ? ? ?  ?  ? ?  ?ASSESSMENT/PLAN: ? ?  ICD-10-CM   ?1. Retinal hemorrhage of both eyes  H35.63   ?  ?2. Essential hypertension  I10   ?  ?3. Hypertensive retinopathy of both eyes  H35.033   ?  ?4. Intermediate stage nonexudative age-related macular degeneration of both eyes  H35.3132   ?  ?5. Pseudophakia of both eyes  Z96.1   ?  ? ? ?1-3. Hypertensive retinopathy with retinal hemorrhages OU  (OD>OS) ?- exam shows scattered dot and blot hemorrhages OU (OD > OS) ?- BP in office 195/80 (2.27.23) ? - pt on metoprolol and flecainide for Afib ?- discussed importance of tight BP control ?- monitor -- no retinal or ophthalmic interventions indicated or recommended  ?- will send note to Cardiologist Diona Browner) and PCP (Burdine) -- pt may benefit from adjustments in BP meds ?- f/u 6-8 weeks, DFE, OCT ? ?4. Age related macular degeneration, non-exudative, OU ?- The incidence, anatomy, and pathology of dry AMD, risk of progression, and the AREDS and AREDS 2 studies including smoking risks discussed with patient. ? - Recommend amsler grid monitoring ? - f/u 3 months, DFE, OCT ? ?5. Pseudophakia OU ? - s/p CE/IOL OU ? - IOLs in good position, doing well ? - monitor ? ?Ophthalmic Meds Ordered this visit:  ?No orders of the defined types were placed in this encounter. ?  ? ?No follow-ups on file. ? ?There are no Patient Instructions on file for this visit. ? ? ?Explained the diagnoses, plan, and follow up with the patient and they expressed understanding.  Patient expressed understanding of the importance of proper follow up care.  ? ? ?This document serves as a record of services personally performed by Karie Chimera, MD, PhD. It was created on their behalf by Julieanne Cotton, COT, an ophthalmic technician. The creation of this record is the provider's dictation and/or activities during the visit.   ? ?Electronically signed by: Julieanne Cotton, COT 09/21/21 10:51 AM  ? ?Karie Chimera, M.D., Ph.D. ?Diseases & Surgery of the Retina and Vitreous ?[facility] ? ? ?Abbreviations: ?M myopia (nearsighted); A astigmatism; H hyperopia (farsighted); P presbyopia; Mrx spectacle prescription;  CTL contact lenses; OD right eye; OS left eye; OU both eyes  XT exotropia; ET esotropia; PEK punctate epithelial keratitis; PEE punctate epithelial erosions; DES dry eye syndrome; MGD meibomian gland  dysfunction; ATs artificial tears; PFAT's preservative free artificial tears; NSC nuclear sclerotic cataract; PSC posterior subcapsular cataract; ERM epi-retinal membrane; PVD posterior vitreous detachment; RD retinal detachment; DM diabetes mellitus; DR diabetic retinopathy; NPDR non-proliferative diabetic retinopathy; PDR proliferative diabetic retinopathy; CSME clinically significant macular edema; DME diabetic macular edema; dbh dot blot hemorrhages; CWS cotton wool spot; POAG primary open angle glaucoma; C/D cup-to-disc ratio; HVF humphrey visual field; GVF goldmann visual field; OCT optical coherence tomography; IOP intraocular pressure; BRVO Branch retinal vein occlusion; CRVO central retinal vein occlusion; CRAO central  retinal artery occlusion; BRAO branch retinal artery occlusion; RT retinal tear; SB scleral buckle; PPV pars plana vitrectomy; VH Vitreous hemorrhage; PRP panretinal laser photocoagulation; IVK intravitreal kenalog; VMT vitreomacular traction; MH Macular hole;  NVD neovascularization of the disc; NVE neovascularization elsewhere; AREDS age related eye disease study; ARMD age related macular degeneration; POAG primary open angle glaucoma; EBMD epithelial/anterior basement membrane dystrophy; ACIOL anterior chamber intraocular lens; IOL intraocular lens; PCIOL posterior chamber intraocular lens; Phaco/IOL phacoemulsification with intraocular lens placement; PRK photorefractive keratectomy; LASIK laser assisted in situ keratomileusis; HTN hypertension; DM diabetes mellitus; COPD chronic obstructive pulmonary disease ? ?

## 2021-10-04 ENCOUNTER — Encounter (INDEPENDENT_AMBULATORY_CARE_PROVIDER_SITE_OTHER): Payer: Medicare Other | Admitting: Ophthalmology

## 2021-10-04 DIAGNOSIS — H35033 Hypertensive retinopathy, bilateral: Secondary | ICD-10-CM

## 2021-10-04 DIAGNOSIS — H3563 Retinal hemorrhage, bilateral: Secondary | ICD-10-CM

## 2021-10-04 DIAGNOSIS — Z961 Presence of intraocular lens: Secondary | ICD-10-CM

## 2021-10-04 DIAGNOSIS — H353132 Nonexudative age-related macular degeneration, bilateral, intermediate dry stage: Secondary | ICD-10-CM

## 2021-10-04 DIAGNOSIS — I10 Essential (primary) hypertension: Secondary | ICD-10-CM

## 2021-12-05 ENCOUNTER — Telehealth: Payer: Self-pay | Admitting: Cardiology

## 2021-12-05 NOTE — Telephone Encounter (Signed)
Pt's son would like a callback regarding hospital f/u for pt. Please advise

## 2021-12-05 NOTE — Telephone Encounter (Signed)
Son notified & verbalized understanding.  Offered appointment with Herma Carson, PA for 12/19/21 in [place] office, but he declined.  Wanted to specifically only see Dr. Diona Browner.  States that she is seeing her pcp on 12/12/21 so he will hold off a bit for Dr. Diona Browner.  States that she is now living at [facility].  Only son is trying to take care of her - [place] about 350 miles away & has been staying in motel the last 12 days since she has been sick.  He will opt for scheduling with [facility] next available and placed on wait list as well.  If he changes his mind, he will call back for sooner appointment with APP in [place] office.

## 2021-12-05 NOTE — Telephone Encounter (Signed)
Per d/c summary - did not see specific time frame for cardiology follow up.  Son Brett Canales) states they were told that she needed to be seen within 7-14 days in order for her Medicare to pay.  Informed him that may be for her pcp ([facility]).  States they are checking with him as well.  Will forward message to provider for advice.

## 2022-03-24 ENCOUNTER — Ambulatory Visit: Payer: Medicare Other | Admitting: Cardiology

## 2022-04-28 DEATH — deceased
